# Patient Record
Sex: Female | Born: 1935 | Race: White | Hispanic: No
Health system: Midwestern US, Academic
[De-identification: ages and names within clinical notes are randomized; demographics above are authoritative.]

---

## 2017-04-03 MED ORDER — LISINOPRIL 20 MG PO TAB
ORAL_TABLET | Freq: Two times a day (BID) | 3 refills | Status: DC
Start: 2017-04-03 — End: 2017-11-13

## 2017-06-16 ENCOUNTER — Encounter: Admit: 2017-06-16 | Discharge: 2017-06-16 | Payer: MEDICARE

## 2017-06-16 MED ORDER — AMLODIPINE 5 MG PO TAB
ORAL_TABLET | Freq: Every day | 1 refills | Status: AC
Start: 2017-06-16 — End: 2018-02-24

## 2017-08-05 ENCOUNTER — Encounter: Admit: 2017-08-05 | Discharge: 2017-08-05 | Payer: MEDICARE

## 2017-08-05 MED ORDER — ATORVASTATIN 10 MG PO TAB
ORAL_TABLET | Freq: Every day | 1 refills | Status: AC
Start: 2017-08-05 — End: 2018-02-24

## 2017-08-05 MED ORDER — CARVEDILOL 12.5 MG PO TAB
ORAL_TABLET | ORAL | 1 refills | 90.00000 days | Status: AC
Start: 2017-08-05 — End: 2018-02-24

## 2017-11-13 ENCOUNTER — Encounter: Admit: 2017-11-13 | Discharge: 2017-11-13 | Payer: MEDICARE

## 2017-11-13 MED ORDER — LISINOPRIL 20 MG PO TAB
20 mg | ORAL_TABLET | Freq: Two times a day (BID) | ORAL | 3 refills | Status: AC
Start: 2017-11-13 — End: 2019-01-12

## 2017-11-13 NOTE — Telephone Encounter
electronic refill of lisinopril

## 2018-01-12 ENCOUNTER — Encounter: Admit: 2018-01-12 | Discharge: 2018-01-12 | Payer: MEDICARE

## 2018-01-28 ENCOUNTER — Encounter: Admit: 2018-01-28 | Discharge: 2018-01-28 | Payer: MEDICARE

## 2018-02-24 ENCOUNTER — Encounter: Admit: 2018-02-24 | Discharge: 2018-02-24 | Payer: MEDICARE

## 2018-02-24 MED ORDER — AMLODIPINE 5 MG PO TAB
ORAL_TABLET | Freq: Every day | 1 refills | Status: AC
Start: 2018-02-24 — End: 2018-09-07

## 2018-02-24 MED ORDER — ATORVASTATIN 10 MG PO TAB
ORAL_TABLET | Freq: Every day | 1 refills | Status: AC
Start: 2018-02-24 — End: 2018-09-07

## 2018-02-24 MED ORDER — CARVEDILOL 12.5 MG PO TAB
ORAL_TABLET | ORAL | 1 refills | 90.00000 days | Status: AC
Start: 2018-02-24 — End: 2018-09-07

## 2018-03-11 ENCOUNTER — Encounter: Admit: 2018-03-11 | Discharge: 2018-03-11 | Payer: MEDICARE

## 2018-03-11 ENCOUNTER — Ambulatory Visit: Admit: 2018-03-11 | Discharge: 2018-03-12 | Payer: MEDICARE

## 2018-03-11 DIAGNOSIS — I10 Essential (primary) hypertension: Secondary | ICD-10-CM

## 2018-03-11 DIAGNOSIS — I493 Ventricular premature depolarization: Secondary | ICD-10-CM

## 2018-03-11 DIAGNOSIS — Z780 Asymptomatic menopausal state: ICD-10-CM

## 2018-03-11 DIAGNOSIS — R609 Edema, unspecified: Principal | ICD-10-CM

## 2018-03-11 DIAGNOSIS — E785 Hyperlipidemia, unspecified: ICD-10-CM

## 2018-03-12 DIAGNOSIS — R9431 Abnormal electrocardiogram [ECG] [EKG]: ICD-10-CM

## 2018-03-12 DIAGNOSIS — R0609 Other forms of dyspnea: Principal | ICD-10-CM

## 2018-03-12 DIAGNOSIS — E785 Hyperlipidemia, unspecified: ICD-10-CM

## 2018-04-08 ENCOUNTER — Encounter: Admit: 2018-04-08 | Discharge: 2018-04-08 | Payer: MEDICARE

## 2018-04-08 DIAGNOSIS — I493 Ventricular premature depolarization: Principal | ICD-10-CM

## 2018-04-08 DIAGNOSIS — I10 Essential (primary) hypertension: ICD-10-CM

## 2018-04-08 LAB — COMPREHENSIVE METABOLIC PANEL
Lab: 78
Lab: 94
Lab: 136

## 2018-04-08 LAB — LIPID PROFILE
Lab: 80
Lab: 95
Lab: 180
Lab: 81

## 2018-04-08 LAB — MAGNESIUM: Lab: 2.5

## 2018-04-08 LAB — THYROID STIMULATING HORMONE-TSH: Lab: 4.4

## 2018-04-08 LAB — CBC: Lab: 5.3

## 2018-04-09 ENCOUNTER — Encounter: Admit: 2018-04-09 | Discharge: 2018-04-09 | Payer: MEDICARE

## 2018-04-10 ENCOUNTER — Ambulatory Visit: Admit: 2018-04-10 | Discharge: 2018-04-10 | Payer: MEDICARE

## 2018-04-10 ENCOUNTER — Encounter: Admit: 2018-04-10 | Discharge: 2018-04-10 | Payer: MEDICARE

## 2018-04-10 DIAGNOSIS — I10 Essential (primary) hypertension: ICD-10-CM

## 2018-04-10 DIAGNOSIS — I493 Ventricular premature depolarization: Principal | ICD-10-CM

## 2018-04-10 DIAGNOSIS — R609 Edema, unspecified: Principal | ICD-10-CM

## 2018-04-10 DIAGNOSIS — Z780 Asymptomatic menopausal state: ICD-10-CM

## 2018-04-10 DIAGNOSIS — E785 Hyperlipidemia, unspecified: ICD-10-CM

## 2018-05-08 ENCOUNTER — Ambulatory Visit: Admit: 2018-05-08 | Discharge: 2018-05-09 | Payer: MEDICARE

## 2018-05-08 ENCOUNTER — Encounter: Admit: 2018-05-08 | Discharge: 2018-05-08 | Payer: MEDICARE

## 2018-05-08 DIAGNOSIS — I10 Essential (primary) hypertension: ICD-10-CM

## 2018-05-08 DIAGNOSIS — R609 Edema, unspecified: Principal | ICD-10-CM

## 2018-05-08 DIAGNOSIS — R002 Palpitations: Secondary | ICD-10-CM

## 2018-05-08 DIAGNOSIS — I493 Ventricular premature depolarization: Secondary | ICD-10-CM

## 2018-05-08 DIAGNOSIS — Z780 Asymptomatic menopausal state: ICD-10-CM

## 2018-05-08 DIAGNOSIS — E785 Hyperlipidemia, unspecified: ICD-10-CM

## 2018-05-09 DIAGNOSIS — I10 Essential (primary) hypertension: Principal | ICD-10-CM

## 2018-05-09 DIAGNOSIS — I491 Atrial premature depolarization: ICD-10-CM

## 2018-05-09 DIAGNOSIS — G4733 Obstructive sleep apnea (adult) (pediatric): Secondary | ICD-10-CM

## 2018-05-09 DIAGNOSIS — E785 Hyperlipidemia, unspecified: ICD-10-CM

## 2018-09-07 ENCOUNTER — Encounter: Admit: 2018-09-07 | Discharge: 2018-09-07 | Payer: MEDICARE

## 2018-09-07 MED ORDER — CARVEDILOL 12.5 MG PO TAB
ORAL_TABLET | ORAL | 1 refills | 90.00000 days | Status: AC
Start: 2018-09-07 — End: 2019-05-04

## 2018-09-07 MED ORDER — ATORVASTATIN 10 MG PO TAB
ORAL_TABLET | Freq: Every day | 1 refills | Status: AC
Start: 2018-09-07 — End: 2020-04-19

## 2018-09-07 MED ORDER — AMLODIPINE 5 MG PO TAB
ORAL_TABLET | Freq: Every day | 1 refills | Status: AC
Start: 2018-09-07 — End: 2018-09-11

## 2018-09-11 ENCOUNTER — Encounter: Admit: 2018-09-11 | Discharge: 2018-09-11 | Payer: MEDICARE

## 2018-09-11 MED ORDER — AMLODIPINE 5 MG PO TAB
5 mg | ORAL_TABLET | Freq: Every day | ORAL | 1 refills | Status: AC
Start: 2018-09-11 — End: 2018-09-18

## 2018-09-18 ENCOUNTER — Encounter: Admit: 2018-09-18 | Discharge: 2018-09-18 | Payer: MEDICARE

## 2018-09-18 MED ORDER — AMLODIPINE 5 MG PO TAB
5 mg | ORAL_TABLET | Freq: Every day | ORAL | 3 refills | Status: AC
Start: 2018-09-18 — End: 2019-11-24

## 2018-11-20 ENCOUNTER — Encounter: Admit: 2018-11-20 | Discharge: 2018-11-20 | Payer: MEDICARE

## 2018-11-20 ENCOUNTER — Ambulatory Visit: Admit: 2018-11-20 | Discharge: 2018-11-21 | Payer: MEDICARE

## 2018-11-20 DIAGNOSIS — Z780 Asymptomatic menopausal state: ICD-10-CM

## 2018-11-20 DIAGNOSIS — I10 Essential (primary) hypertension: ICD-10-CM

## 2018-11-20 DIAGNOSIS — E785 Hyperlipidemia, unspecified: ICD-10-CM

## 2018-11-20 DIAGNOSIS — R609 Edema, unspecified: Principal | ICD-10-CM

## 2018-11-21 DIAGNOSIS — I10 Essential (primary) hypertension: Principal | ICD-10-CM

## 2018-11-21 DIAGNOSIS — Z8679 Personal history of other diseases of the circulatory system: ICD-10-CM

## 2018-11-21 DIAGNOSIS — I493 Ventricular premature depolarization: Secondary | ICD-10-CM

## 2018-11-21 DIAGNOSIS — E785 Hyperlipidemia, unspecified: Secondary | ICD-10-CM

## 2019-01-12 ENCOUNTER — Encounter: Admit: 2019-01-12 | Discharge: 2019-01-12 | Payer: MEDICARE

## 2019-01-12 MED ORDER — LISINOPRIL 20 MG PO TAB
ORAL_TABLET | Freq: Two times a day (BID) | 1 refills | Status: AC
Start: 2019-01-12 — End: 2019-08-02

## 2019-02-04 ENCOUNTER — Encounter: Admit: 2019-02-04 | Discharge: 2019-02-04 | Payer: MEDICARE

## 2019-04-12 ENCOUNTER — Encounter: Admit: 2019-04-12 | Discharge: 2019-04-12 | Payer: MEDICARE

## 2019-04-12 NOTE — Telephone Encounter
Pt's daughter Darel Hong called to let us know pt is having lower extremity edema for the past few weeks and is progressively getting worse. Daughter states PCP Dr Kandis Ban took her off of diuretic recently. Pt slightly increased SOA. Dr Bufford Buttner is out for a couple of weeks. Daughter will call PCP and have her seen by PCP to see about putting her back on diuretic. Daughter was offered appointment through tele health but declined stating they do not have good wifi. Daughter will call us back if she is unable to see Dr Kandis Ban.

## 2019-05-04 ENCOUNTER — Encounter: Admit: 2019-05-04 | Discharge: 2019-05-04 | Payer: MEDICARE

## 2019-05-04 MED ORDER — CARVEDILOL 12.5 MG PO TAB
ORAL_TABLET | ORAL | 2 refills | 90.00000 days | Status: DC
Start: 2019-05-04 — End: 2020-02-22

## 2019-05-18 ENCOUNTER — Encounter: Admit: 2019-05-18 | Discharge: 2019-05-18

## 2019-05-18 NOTE — Progress Notes
Patient: Nicole Avery, DOB: [birth year]    Requesting most recent blood work and/or testing, to be faxed for visit with Dr. Murvin Natal.    Please fax to  Attention: Dr. Murvin Natal at the [facility]    Fax number [phone]  Thank you.

## 2019-05-20 ENCOUNTER — Encounter: Admit: 2019-05-20 | Discharge: 2019-05-20

## 2019-05-20 NOTE — Progress Notes
05/20/2019 8:45 AM Labs entered from PCP for our records for upcoming OV.

## 2019-06-01 ENCOUNTER — Encounter: Admit: 2019-06-01 | Discharge: 2019-06-01

## 2019-06-01 DIAGNOSIS — E7849 Other hyperlipidemia: Secondary | ICD-10-CM

## 2019-06-01 DIAGNOSIS — R609 Edema, unspecified: Secondary | ICD-10-CM

## 2019-06-01 DIAGNOSIS — I10 Essential (primary) hypertension: Secondary | ICD-10-CM

## 2019-06-01 DIAGNOSIS — I493 Ventricular premature depolarization: Secondary | ICD-10-CM

## 2019-06-01 DIAGNOSIS — E785 Hyperlipidemia, unspecified: Secondary | ICD-10-CM

## 2019-06-01 DIAGNOSIS — R002 Palpitations: Secondary | ICD-10-CM

## 2019-06-01 DIAGNOSIS — Z780 Asymptomatic menopausal state: Secondary | ICD-10-CM

## 2019-06-01 DIAGNOSIS — R0609 Other forms of dyspnea: Secondary | ICD-10-CM

## 2019-06-01 NOTE — Progress Notes
Date of Service: 06/01/2019    Nicole Avery is a 85 y.o. female.       HPI     It was a pleasure to see Nicole Avery in my office today for her cardiovascular followup.     As you may recall, Nicole Avery has recently turned 85 years of age.  For her age, she is doing fairly good.  Because of the coronavirus, unfortunately, she has been staying mostly indoors.  She still works in her garden.     Apparently sometime in the last month or so, she developed some swelling of her legs.  She was started on a diuretic for about a month.  However, after a week, she was told to discontinue the diuretic and use it only on a p.r.n. basis.  She does not remember the name of the diuretic.     She states that her legs swell up on and off.  However, today in the clinic, there was very little leg edema, mostly in the right lower extremity.  Her weight has been stable.  She does not seem to be in any volume overload as such.     Considering her age, I felt that it may be a good idea for her not to take the diuretic on a regular basis and, as you had suggested, to take it only on a p.r.n. basis.  We have told her that if she gains more than 2 pounds of weight on 2 consecutive days, then she can take her diuretic.     I have also advised her to continue to follow her leg edema.  If the right leg gets any worse, we may consider a venous duplex study to rule out DVT also in her.     Blood pressures appear to be reasonably controlled at this time.  So I did not make any changes with her medications.     In summary, the recommendations were as follows:     1. Continue to monitor your weight.  If you gain more than 2 pounds on 2 consecutive days, take a diuretic pill.  2. Low-sodium diet.    3. If you feel that the right leg is unilaterally swollen more, please give me a call.    4. Continue to be physically active even though you are in quarantine.  It is safe to go outside, especially in the early mornings and late evenings when there are not too many people outside.  5. Follow up with me in 6 months unless there is any change in your symptoms.     Total time spent with the patient, including counseling, was approximately 20 minutes in the clinic.    ([id number])             Vitals:    06/01/19 1523   BP: (!) 148/76   BP Source: Arm, Left Upper   Weight: 80.3 kg (177 lb)   Height: 1.575 m (5' 2)   PainSc: Zero     Body mass index is 32.37 kg/m???.     Past Medical History  Patient Active Problem List    Diagnosis Date Noted   ??? PVC (premature ventricular contraction) 02/25/2014   ??? SOB (shortness of breath) 11/05/2011   ??? Dyslipidemia 11/05/2011   ??? GERD (gastroesophageal reflux disease) 11/04/2011   ??? HTN (hypertension) 11/04/2011         Review of Systems   Cardiovascular: Positive for leg swelling.       Physical  Exam  General Appearance: resting comfortably, no acute distress   Neck Veins: neck veins are flat, neck veins are not distended   Thyroid: no nodules, masses, tenderness or enlargement   Respiratory Effort: breathing is unlabored, no respiratory distress   Auscultation/Percussion: lungs clear to auscultation, no rales, rhonchi, or wheezing   PMI: PMI not enlarged or displaced   Cardiac Rhythm: regular rhythm and normal rate   Cardiac Auscultation: Normal S1 &???S2, no S3 or S4, no rub   Murmurs:???2/6SEM at the base???  Carotid Arteries: normal carotid upstroke bilaterally, no bruits   Pedal Pulses: normal symmetric pedal pulses   Lower Extremity Edema: no???significant???lower extremity edema   Abdominal Exam: soft, non-tender, no masses, bowel sounds normal   Abdominal Aorta: nonpalpable abdominal aorta; no abdominal bruits   Liver &???Spleen: no organomegaly   Neurologic Exam: neurological assessment grossly intact   Orientation: oriented to time, place and person    Cardiovascular Studies  1. May 28, 2014, regadenoson stress thallium showed no evidence of any significant ischemia. Normal left ventricular function. 2. June 02, 2014, echo Doppler. Normal left ventricular function. Ejection fraction 65%. Mild mitral and mild tricuspid regurgitation.  3. June 23, 2014, Holter monitor revealed predominantly a sinus rhythm with an average heart rate of 66 beats per minute. Frequent premature ventricular ectopic beats were noted with ventricular trigeminy, bigeminy and couplets. Total ventricular beats were noted to be about 10% of her recorded heartbeats. No episodes of ventricular tachycardia were noted.  4. January 24, 2016: ???Echo Doppler normal left ventricular size and normal ventricular function, ejection fraction 65%, mild left atrial enlargement. ???No significant valvular abnormalities. ???No pericardial effusion.    Problems Addressed Today  No diagnosis found.    Assessment and Plan     1.   Dyspnea on exertion and leg edema, stable.??????Echo Doppler done???in 2017???had revealed a normal LV function.  2.   History of frequent premature ventricular contraction, asymptomatic.   3.   Hypertension.??????Blood pressures are reasonably well controlled.   4.   Dyslipidemia.??? Good lipid numbers.         Current Medications (including today's revisions)  ??? amLODIPine (NORVASC) 5 mg tablet Take one tablet by mouth daily.   ??? atorvastatin (LIPITOR) 10 mg tablet TAKE 1 TABLET BY MOUTH ONCE DAILY   ??? carvediloL (COREG) 12.5 mg tablet TAKE 1 & 1/2 (ONE & ONE-HALF) TABLETS BY MOUTH IN THE MORNING AND 1 IN THE EVENING   ??? lisinopril (ZESTRIL) 20 mg tablet TAKE 1 TABLET BY MOUTH TWICE DAILY   ??? SUCRALFATE PO Take 150 mg by mouth four times daily.   ??? vit C/vit E ac/lut/copper/zinc (PRESERVISION LUTEIN PO) Take  by mouth.

## 2019-06-02 ENCOUNTER — Ambulatory Visit: Admit: 2019-06-01 | Discharge: 2019-06-02

## 2019-06-02 DIAGNOSIS — R600 Localized edema: Secondary | ICD-10-CM

## 2019-06-02 DIAGNOSIS — Z8679 Personal history of other diseases of the circulatory system: Secondary | ICD-10-CM

## 2019-06-02 DIAGNOSIS — I10 Essential (primary) hypertension: Secondary | ICD-10-CM

## 2019-08-01 ENCOUNTER — Encounter: Admit: 2019-08-01 | Discharge: 2019-08-01

## 2019-08-02 MED ORDER — LISINOPRIL 20 MG PO TAB
ORAL_TABLET | Freq: Two times a day (BID) | 3 refills | Status: AC
Start: 2019-08-02 — End: ?

## 2019-11-24 ENCOUNTER — Encounter: Admit: 2019-11-24 | Discharge: 2019-11-24 | Payer: MEDICARE

## 2019-11-24 MED ORDER — AMLODIPINE 5 MG PO TAB
ORAL_TABLET | Freq: Every day | 1 refills | Status: DC
Start: 2019-11-24 — End: 2020-05-26

## 2020-02-22 ENCOUNTER — Encounter: Admit: 2020-02-22 | Discharge: 2020-02-22 | Payer: MEDICARE

## 2020-02-22 MED ORDER — CARVEDILOL 12.5 MG PO TAB
ORAL_TABLET | ORAL | 0 refills | 90.00000 days | Status: DC
Start: 2020-02-22 — End: 2020-05-26

## 2020-03-21 ENCOUNTER — Encounter: Admit: 2020-03-21 | Discharge: 2020-03-21 | Payer: MEDICARE

## 2020-03-21 NOTE — Progress Notes
Patient: Nicole Avery, DOB: 1923/12/18    Requesting most recent blood work and/or testing, to be faxed to Dr. Bufford Buttner.    Please fax to  Attention: Dr. Bufford Buttner at the [facility]    Fax number [phone]  Thank you.

## 2020-03-28 NOTE — Patient Instructions
Thank you for visiting our office today.     Please take your medications regularly. ?Check your list with what you have on hand at home.     We recommend follow-up with our office in 6 months. ?Please call [phone] in 2 months to schedule your 6 month follow up.    Please complete the following:     Check you labs at your earliest convenience, plan to fast 10-12 hours prior.     Weigh yourself daily after waking up and using toilet, before you eat or drink anything.   Record the weights and report weight gain of 2 pounds in two days or 5 pounds in five days.   Heart healthy, low salt diet will also help keep blood pressure lower and prevent water weight gain. Do not add any salt to your food, and limit fried food, canned foods and lunch meat, which all typically have added salt.

## 2020-03-28 NOTE — Progress Notes
Date of Service: 03/28/2020    Nicole Avery is a 86 y.o. female.       HPI     I had the pleasure of seeing Nicole Avery in my office today for her cardiovascular followup.  As always, she was accompanied by her daughter, Raynelle Fanning.     Nicole Avery is 86 years old with a history of congestive heart failure with preserved ejection fraction.     She continues to complain of dyspnea on exertion and feeling tired.  However, there has been no significant change in the last 6 months.  Considering her age, she is pretty active.  Due to the coronavirus guidelines, obviously, much of going out of the house has been very limited over the year.  On and off, she says, she notices swelling of her legs, but most of the time, she says, she does pretty well with the swelling.  There is no history of orthopnea or paroxysmal nocturnal dyspnea.     On physical examination, her vital signs are stable.  Lungs are clear.  Heart sounds are regular.  Extremities show no significant edema.  Her weight is stable at 177 pounds.  There are no clinical signs of volume overload.    ([id number])             Vitals:    03/28/20 1407 03/28/20 1415   BP: 120/70 138/70   BP Source: Arm, Left Lower Arm, Right Upper   Patient Position: Sitting Sitting   Pulse: 62    Weight: 80.4 kg (177 lb 4.8 oz)    Height: 1.575 m (5' 2)    PainSc: Zero      Body mass index is 32.43 kg/m?Marland Kitchen     Past Medical History  Patient Active Problem List    Diagnosis Date Noted   ? PVC (premature ventricular contraction) 02/25/2014   ? SOB (shortness of breath) 11/05/2011   ? Dyslipidemia 11/05/2011   ? GERD (gastroesophageal reflux disease) 11/04/2011   ? HTN (hypertension) 11/04/2011         Review of Systems   Constitution: Negative.   HENT: Negative.    Eyes: Negative.    Cardiovascular: Positive for dyspnea on exertion and leg swelling.   Respiratory: Negative.    Endocrine: Negative.    Hematologic/Lymphatic: Negative.    Skin: Negative.    Gastrointestinal: Negative. Genitourinary: Negative.    Neurological: Positive for loss of balance.   Psychiatric/Behavioral: Negative.    Allergic/Immunologic: Negative.        Physical Exam  General Appearance: resting comfortably, no acute distress   Neck Veins: neck veins are not distended   Thyroid: no nodules, masses, tenderness or enlargement   Respiratory Effort: breathing is unlabored, no respiratory distress   Auscultation/Percussion: lungs clear to auscultation, no rales, rhonchi, or wheezing   PMI: PMI not enlarged or displaced   Cardiac Rhythm: regular rhythm and normal rate   Cardiac Auscultation: Normal S1 &?S2, no S3 or S4, no rub   Murmurs:?2/6SEM at the base?  Carotid Arteries: normal carotid upstroke bilaterally, no bruits   Pedal Pulses: normal symmetric pedal pulses   Lower Extremity Edema: no?significant?lower extremity edema   Abdominal Exam: soft, non-tender, no masses, bowel sounds normal   Abdominal Aorta: nonpalpable abdominal aorta; no abdominal bruits   Liver &?Spleen: no organomegaly   Neurologic Exam: neurological assessment grossly intact   Orientation: oriented to time, place and person    Cardiovascular Studies  1. May 28, 2014, regadenoson  stress thallium showed no evidence of any significant ischemia. Normal left ventricular function.  2. June 02, 2014, echo Doppler. Normal left ventricular function. Ejection fraction 65%. Mild mitral and mild tricuspid regurgitation.  3. June 23, 2014, Holter monitor revealed predominantly a sinus rhythm with an average heart rate of 66 beats per minute. Frequent premature ventricular ectopic beats were noted with ventricular trigeminy, bigeminy and couplets. Total ventricular beats were noted to be about 10% of her recorded heartbeats. No episodes of ventricular tachycardia were noted.  4. January 24, 2016: ?Echo Doppler normal left ventricular size and normal ventricular function, ejection fraction 65%, mild left atrial enlargement. ?No significant valvular abnormalities. ?No pericardial effusion.  5. EKG today shows sinus rhythm rate of 62 bpm.  There is borderline first-degree AV block.  Left axis deviation with left anterior fascicular block is noted.  There is no significant ST-T changes noted.  There are no significant changes noted as compared to the EKG done on Apr 10, 2018.    Problems Addressed Today  Encounter Diagnoses   Name Primary?   ? Essential hypertension Yes   ? Dyslipidemia    ? PVC (premature ventricular contraction)    ? Obstructive sleep apnea        Assessment and Plan     1.  Congestive heart failure with preserved ejection fraction.  Clinical hemodynamically doing well.  Symptoms are stable.  No signs of volume overload.  Patient given guidelines to make sure that she continues to have low-sodium diet and follow her daily weights.  Needs a follow-up metabolic profile.  2.   History of frequent premature ventricular contraction, asymptomatic.   3.   Hypertension.??Blood pressures are reasonably well controlled.   4.   Dyslipidemia.? Good lipid numbers.  Needs a follow-up lipid check.         Current Medications (including today's revisions)  ? amLODIPine (NORVASC) 5 mg tablet Take 1 tablet by mouth once daily   ? atorvastatin (LIPITOR) 10 mg tablet TAKE 1 TABLET BY MOUTH ONCE DAILY   ? carvediloL (COREG) 12.5 mg tablet TAKE 1 & 1/2 (ONE & ONE-HALF) TABLETS BY MOUTH IN THE MORNING AND 1 TABLET IN THE EVENING   ? lisinopriL (ZESTRIL) 20 mg tablet Take 1 tablet by mouth twice daily   ? SUCRALFATE PO Take 150 mg by mouth four times daily.   ? vit C/vit E ac/lut/copper/zinc (PRESERVISION LUTEIN PO) Take  by mouth.

## 2020-04-18 ENCOUNTER — Encounter: Admit: 2020-04-18 | Discharge: 2020-04-18 | Payer: MEDICARE

## 2020-04-18 DIAGNOSIS — R0600 Dyspnea, unspecified: Secondary | ICD-10-CM

## 2020-04-18 DIAGNOSIS — E785 Hyperlipidemia, unspecified: Secondary | ICD-10-CM

## 2020-04-18 DIAGNOSIS — I10 Essential (primary) hypertension: Secondary | ICD-10-CM

## 2020-04-18 LAB — COMPREHENSIVE METABOLIC PANEL
Lab: 100 pg (ref 26–34)
Lab: 137 % (ref 40–50)
Lab: 22 g/dL (ref 32.0–36.0)
Lab: 4.3 FL (ref 80–100)

## 2020-04-18 LAB — LIPID PROFILE
Lab: 137 10*3/uL (ref 150–400)
Lab: 256 % — ABNORMAL HIGH (ref 100–199)
Lab: 64 FL — ABNORMAL LOW (ref 7–11)

## 2020-04-19 ENCOUNTER — Encounter: Admit: 2020-04-19 | Discharge: 2020-04-19 | Payer: MEDICARE

## 2020-04-19 MED ORDER — ATORVASTATIN 10 MG PO TAB
10 mg | ORAL_TABLET | Freq: Every day | ORAL | 1 refills | Status: AC
Start: 2020-04-19 — End: ?

## 2020-05-26 ENCOUNTER — Encounter: Admit: 2020-05-26 | Discharge: 2020-05-26 | Payer: MEDICARE

## 2020-05-26 MED ORDER — AMLODIPINE 5 MG PO TAB
ORAL_TABLET | Freq: Every day | 0 refills | Status: AC
Start: 2020-05-26 — End: ?

## 2020-05-26 MED ORDER — CARVEDILOL 12.5 MG PO TAB
ORAL_TABLET | ORAL | 0 refills | 90.00000 days | Status: AC
Start: 2020-05-26 — End: ?

## 2020-09-11 ENCOUNTER — Encounter: Admit: 2020-09-11 | Discharge: 2020-09-11 | Payer: MEDICARE

## 2020-09-11 MED ORDER — LISINOPRIL 20 MG PO TAB
ORAL_TABLET | Freq: Two times a day (BID) | 0 refills
Start: 2020-09-11 — End: ?

## 2020-09-11 MED ORDER — AMLODIPINE 5 MG PO TAB
ORAL_TABLET | Freq: Every day | 3 refills | Status: AC
Start: 2020-09-11 — End: ?

## 2020-09-11 MED ORDER — CARVEDILOL 12.5 MG PO TAB
ORAL_TABLET | ORAL | 3 refills | 90.00000 days | Status: AC
Start: 2020-09-11 — End: ?

## 2020-11-14 ENCOUNTER — Encounter: Admit: 2020-11-14 | Discharge: 2020-11-14 | Payer: MEDICARE

## 2020-11-14 NOTE — Progress Notes
11/14/2020 1:23 PM   Patient's daughter called to request an appointment with [facility] for "mini-strokes." Daughter Darel Hong is concerned that pt has been having TIAs. Pt reported to Darel Hong that sometimes at night her "feet don't work" and that yesterday she walked into the kitchen and couldn't remember why she went in. Asked Darel Hong if pt had considered going to the ED for any of these concerns. Daughter states that pt flatly refuses to go to the ED and requested to just see [facility]. Offered work-in appointment on 12/14. Pt unavailable that day. Work in scheduled for 12/20 with [facility]. Darel Hong will be accompanying patient to this appointment. Confirmed date time and location. Reinforced to Darel Hong to take pt to ED if she sees any signs or symptoms that concern her for a stroke.

## 2020-12-11 ENCOUNTER — Encounter: Admit: 2020-12-11 | Discharge: 2020-12-11 | Payer: MEDICARE

## 2020-12-11 MED ORDER — ATORVASTATIN 10 MG PO TAB
ORAL_TABLET | Freq: Every day | 0 refills | Status: AC
Start: 2020-12-11 — End: ?

## 2021-01-10 ENCOUNTER — Encounter: Admit: 2021-01-10 | Discharge: 2021-01-10 | Payer: MEDICARE

## 2021-01-10 NOTE — Progress Notes
Nicole Avery, 06-13-1924 has an appointment with Dr. Bufford Buttner on 01/16/21.    Please send recent lab results for continuity of care.    Thank you,   [name]    Phone: [phone]  Fax: [phone]

## 2021-01-16 ENCOUNTER — Encounter: Admit: 2021-01-16 | Discharge: 2021-01-16 | Payer: MEDICARE

## 2021-01-16 ENCOUNTER — Ambulatory Visit: Admit: 2021-01-16 | Discharge: 2021-01-17 | Payer: MEDICARE

## 2021-01-16 DIAGNOSIS — I10 Essential (primary) hypertension: Secondary | ICD-10-CM

## 2021-01-16 DIAGNOSIS — R0600 Dyspnea, unspecified: Secondary | ICD-10-CM

## 2021-01-16 DIAGNOSIS — E785 Hyperlipidemia, unspecified: Secondary | ICD-10-CM

## 2021-01-16 DIAGNOSIS — R609 Edema, unspecified: Secondary | ICD-10-CM

## 2021-01-16 DIAGNOSIS — Z780 Asymptomatic menopausal state: Secondary | ICD-10-CM

## 2021-01-17 DIAGNOSIS — I5032 Chronic diastolic (congestive) heart failure: Secondary | ICD-10-CM

## 2021-02-07 ENCOUNTER — Ambulatory Visit: Admit: 2021-02-07 | Discharge: 2021-02-07 | Payer: MEDICARE

## 2021-02-07 ENCOUNTER — Encounter: Admit: 2021-02-07 | Discharge: 2021-02-07 | Payer: MEDICARE

## 2021-02-07 DIAGNOSIS — E785 Hyperlipidemia, unspecified: Secondary | ICD-10-CM

## 2021-02-07 DIAGNOSIS — I10 Essential (primary) hypertension: Secondary | ICD-10-CM

## 2021-02-07 DIAGNOSIS — I5032 Chronic diastolic (congestive) heart failure: Secondary | ICD-10-CM

## 2021-02-07 DIAGNOSIS — R0600 Dyspnea, unspecified: Secondary | ICD-10-CM

## 2021-02-13 ENCOUNTER — Encounter: Admit: 2021-02-13 | Discharge: 2021-02-13 | Payer: MEDICARE

## 2021-03-09 ENCOUNTER — Encounter: Admit: 2021-03-09 | Discharge: 2021-03-09 | Payer: MEDICARE

## 2021-03-09 MED ORDER — ATORVASTATIN 10 MG PO TAB
ORAL_TABLET | Freq: Every day | 0 refills | Status: AC
Start: 2021-03-09 — End: ?

## 2021-03-15 ENCOUNTER — Encounter: Admit: 2021-03-15 | Discharge: 2021-03-15 | Payer: MEDICARE

## 2021-03-15 MED ORDER — ATORVASTATIN 10 MG PO TAB
ORAL_TABLET | Freq: Every day | 0 refills | Status: AC
Start: 2021-03-15 — End: ?

## 2021-03-27 ENCOUNTER — Encounter: Admit: 2021-03-27 | Discharge: 2021-03-27 | Payer: MEDICARE

## 2021-03-27 DIAGNOSIS — E785 Hyperlipidemia, unspecified: Secondary | ICD-10-CM

## 2021-03-27 DIAGNOSIS — R0600 Dyspnea, unspecified: Secondary | ICD-10-CM

## 2021-03-27 DIAGNOSIS — I5032 Chronic diastolic (congestive) heart failure: Secondary | ICD-10-CM

## 2021-03-27 DIAGNOSIS — I10 Essential (primary) hypertension: Secondary | ICD-10-CM

## 2021-03-27 LAB — COMPREHENSIVE METABOLIC PANEL
Lab: 0.7
Lab: 0.9
Lab: 102
Lab: 112 — AB
Lab: 14
Lab: 140
Lab: 29
Lab: 3.6
Lab: 5.1
Lab: 6.7
Lab: 8.8
Lab: 83

## 2021-03-29 ENCOUNTER — Encounter: Admit: 2021-03-29 | Discharge: 2021-03-29 | Payer: MEDICARE

## 2021-08-16 ENCOUNTER — Encounter: Admit: 2021-08-16 | Discharge: 2021-08-16 | Payer: MEDICARE

## 2021-08-16 NOTE — Progress Notes
Nicole Avery, March 16, 1924 has an appointment with Dr. Bufford Buttner on 08/24/21.    Please send recent lab results for continuity of care.    Thank you,   [name]    Phone: [phone]  Fax: [phone]

## 2021-08-20 ENCOUNTER — Encounter: Admit: 2021-08-20 | Discharge: 2021-08-20 | Payer: MEDICARE

## 2021-08-20 NOTE — Progress Notes
No new labs per PCP

## 2021-08-27 ENCOUNTER — Encounter: Admit: 2021-08-27 | Discharge: 2021-08-27 | Payer: MEDICARE

## 2021-09-19 ENCOUNTER — Encounter: Admit: 2021-09-19 | Discharge: 2021-09-19 | Payer: MEDICARE

## 2021-09-19 MED ORDER — LISINOPRIL 20 MG PO TAB
ORAL_TABLET | Freq: Two times a day (BID) | 1 refills | Status: AC
Start: 2021-09-19 — End: ?

## 2021-09-19 NOTE — Telephone Encounter
Patient overdue for OV will send IB to scheduler.

## 2021-11-29 IMAGING — CR [ID]
1 series · 1 of 1 positions shown · non-contrast
Comparison: none

[x chest ap]
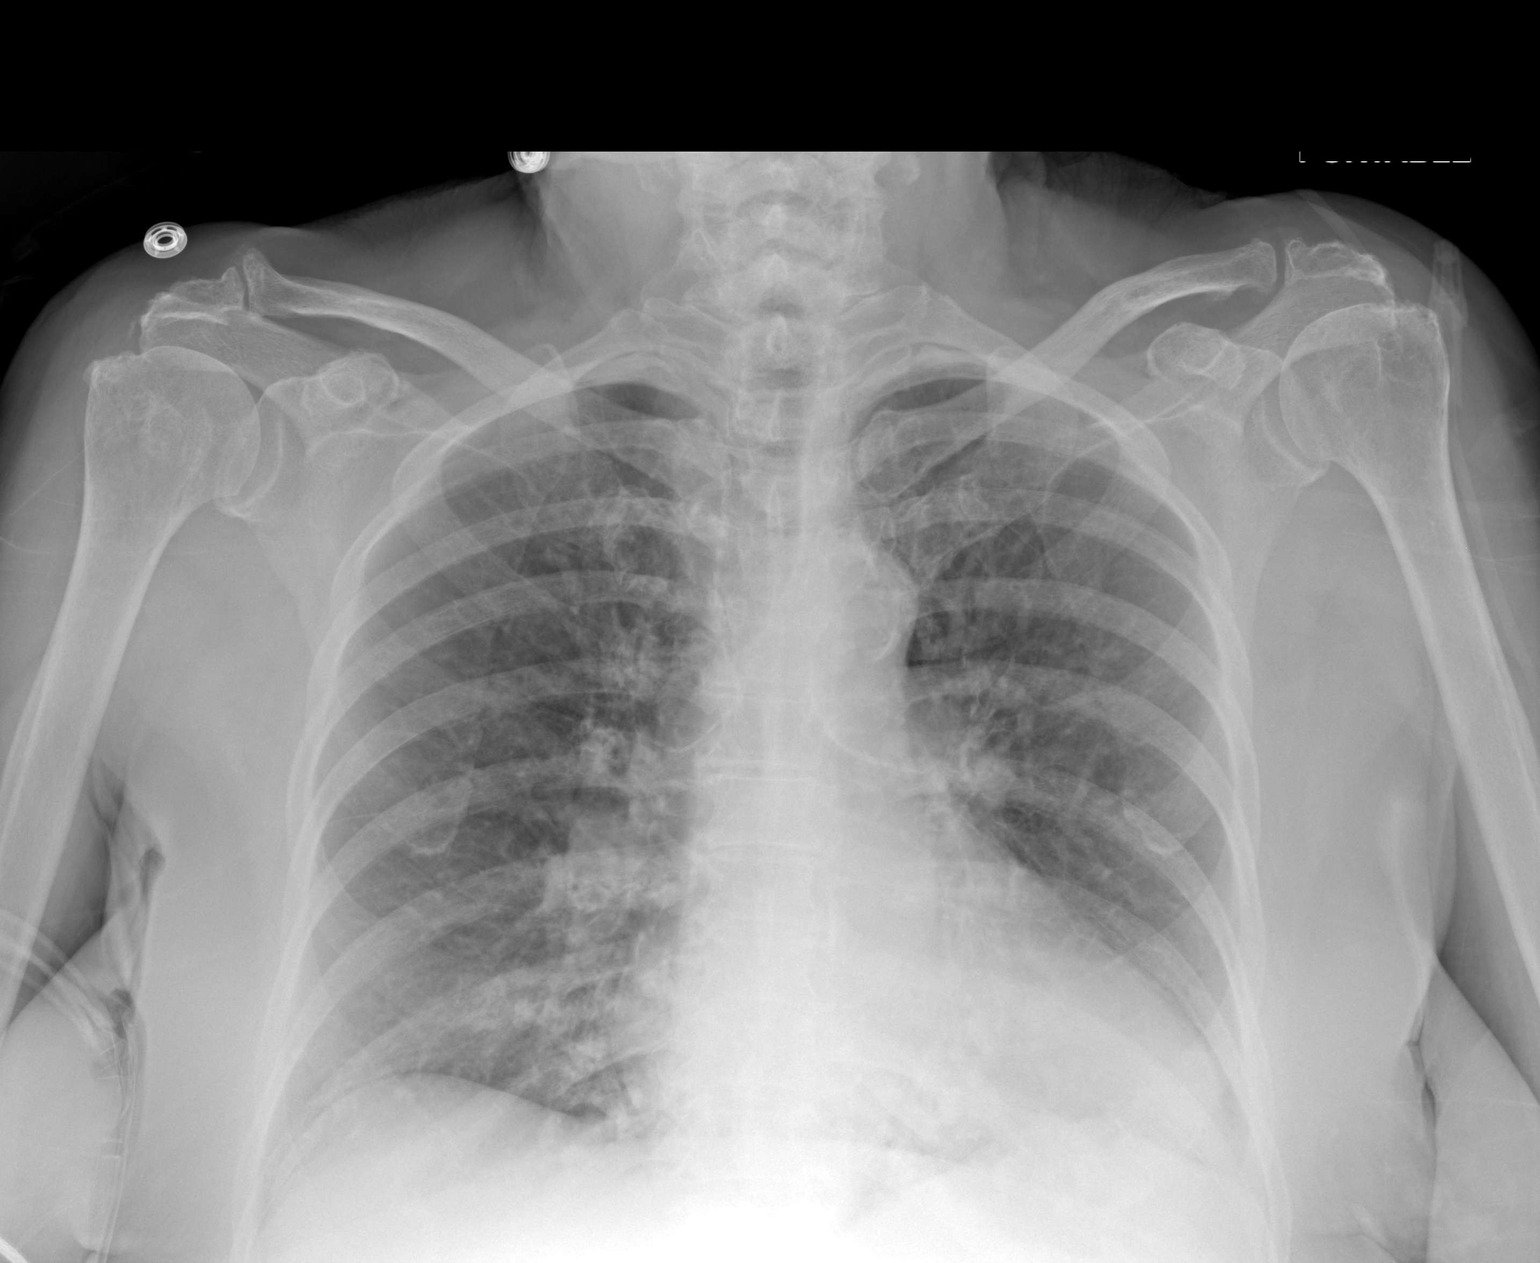

[1 of 1 positions shown; findings below may reference images not displayed]

Primary:     TEL, AATMAN                                                        Date: 12/

DIAGNOSTIC STUDIES

EXAM

XR chest 1V

INDICATION

ams
Altered mental status. CF/HB

TECHNIQUE

Portable upright chest

COMPARISONS

None available

FINDINGS

Borderline cardiomegaly is noted. No acute infiltrates are seen. Osseous structures are within
normal limits. There is calcification of the mitral valve annulus.

IMPRESSION

No acute infiltrates.

Tech Notes:

Altered mental status. CF/HB

## 2021-11-29 IMAGING — CT AGHEAD
1 series · 1 of 1 positions shown, 2 images · non-contrast
Comparison: none

[Series 15: cta brain and/or neck 1.00 bv36 s3 · axial · 0.59mm/px · z∈[-602,-602]mm · 1 of 1 slices shown, 2 images]
[im 1/1  soft-tissue]
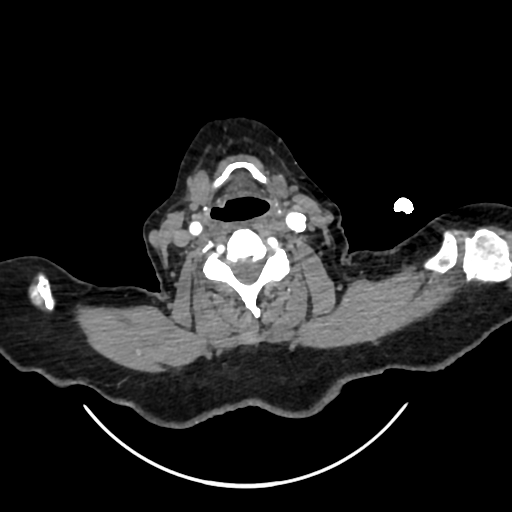
[im 1/1  bone]
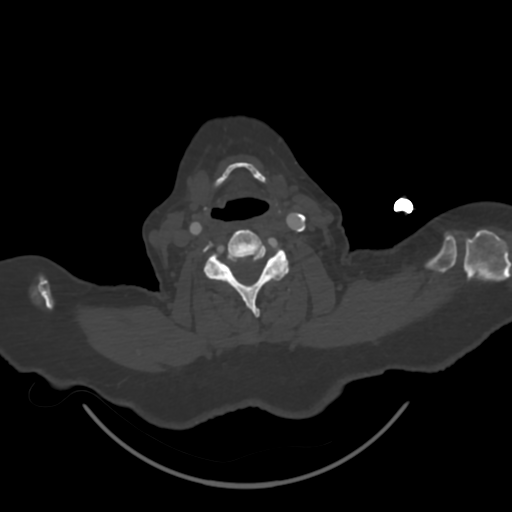

[1 of 1 positions shown; findings below may reference images not displayed]

Primary:     MASANE, SHUBAM                                                        Date: 12/

DIAGNOSTIC STUDIES

EXAM

CT angiogram of the head neck with IV contrast

INDICATION

Stroke
stroke , ams

TECHNIQUE

All CT scans at this facility use dose modulation, iterative reconstruction, and/or weight based
dosing when appropriate to reduce radiation dose to as low as reasonably achievable. 3D reformats

Number of previous computed tomography exams in the last 12 months is 0  .

Number of previous nuclear medicine myocardial perfusion studies in the last 12 months is 0  .

COMPARISONS

None available.

FINDINGS

CTA HEAD:

Bilateral ACA, MCA, and PCA are patent.

CTA NECK:

Mild plaque of the aortic arch and proximal vessels. Bilateral vertebral arteries are patent.

Right common carotid and ICA demonstrate mild plaque. No stenosis by NASCET criteria.

Minimal plaque in the left carotid bulb. No stenosis by NASCET criteria.

No focal consolidation in the lung apices. No acute osseous findings. Degenerative disc disease in
the cervical spine.

IMPRESSION

No hemodynamically significant stenosis, occlusion, or dilatation in the head or neck.

Tech Notes:

stroke r/o

## 2021-11-29 IMAGING — CT BRAIN WO(Adult)
3 of 4 series · 14 of 47 positions shown, 16 images · non-contrast
Comparison: none

[Series 4: brain cor 5.00 hr40 s3 · coronal · 0.28mm/px · 3 of 35 slices shown]
[im 12/35  brain]
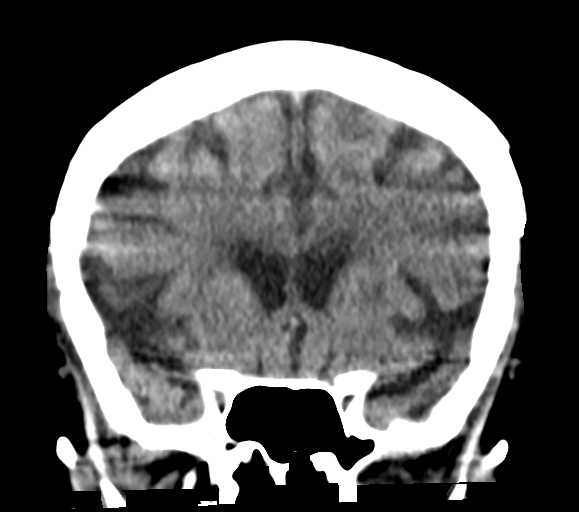
[im 16/35  brain]
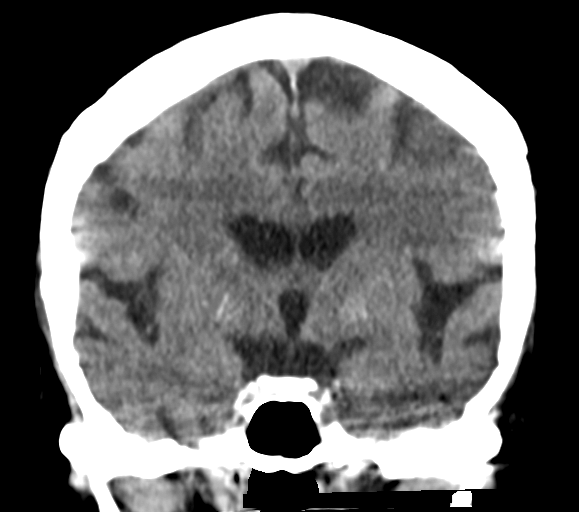
[im 19/35  brain]
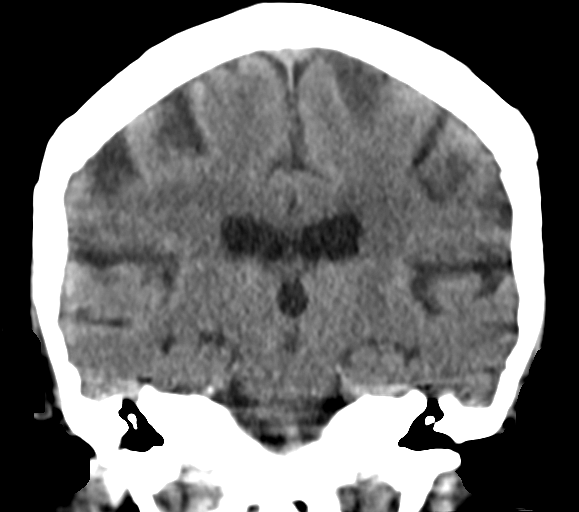

[Series 6: brain sag 5.00 hr40 s3 · sagittal · 0.28mm/px · 3 of 30 slices shown]
[im 10/30  brain]
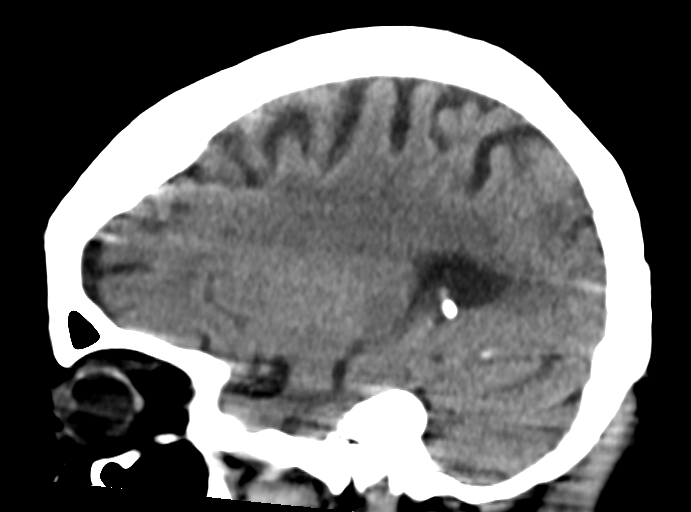
[im 15/30  brain]
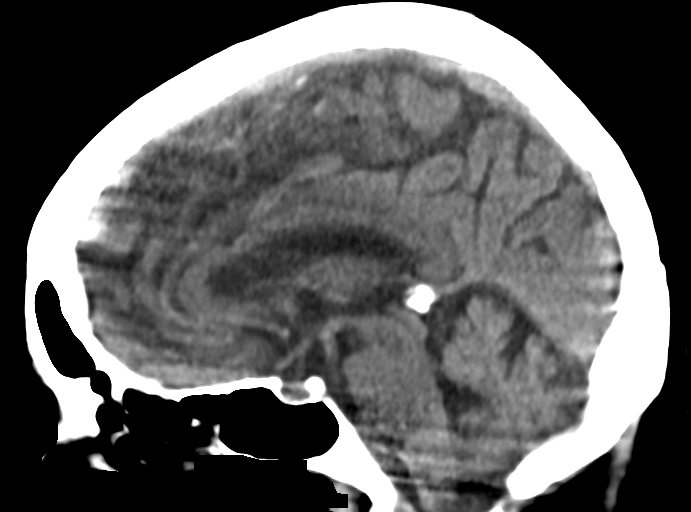
[im 20/30  brain]
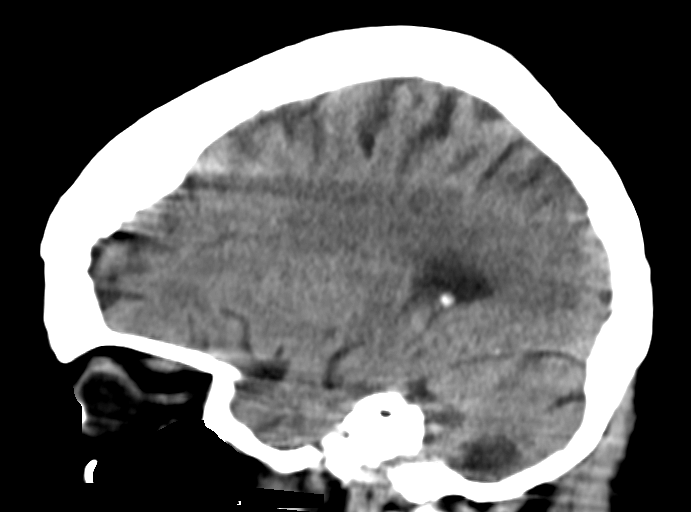

[Series 8: brain ax 2.00 hr60 s3 · axial · 0.34mm/px · z∈[-564,-458]mm · 8 of 65 slices shown, 10 images]
[im 7/65  brain]
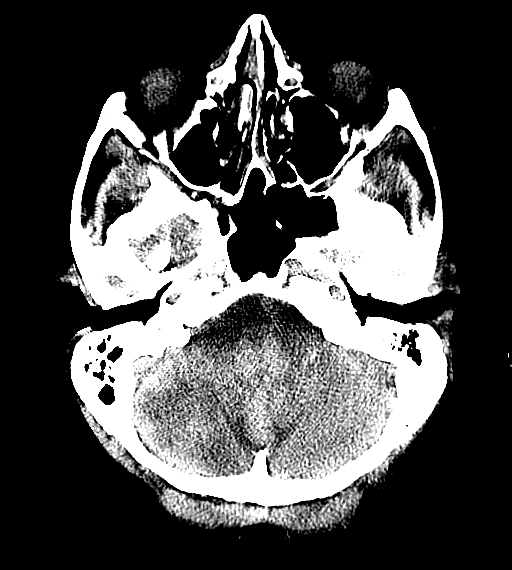
[im 7/65  bone]
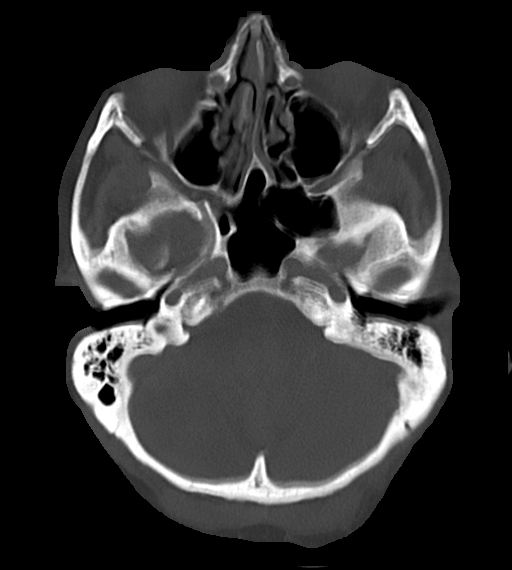
[im 13/65  brain]
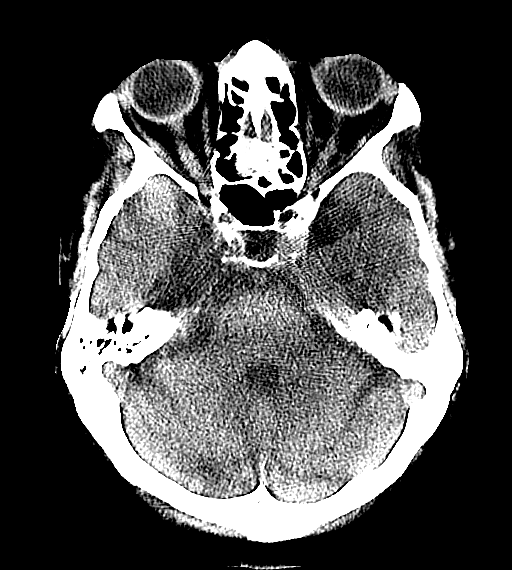
[im 20/65  brain]
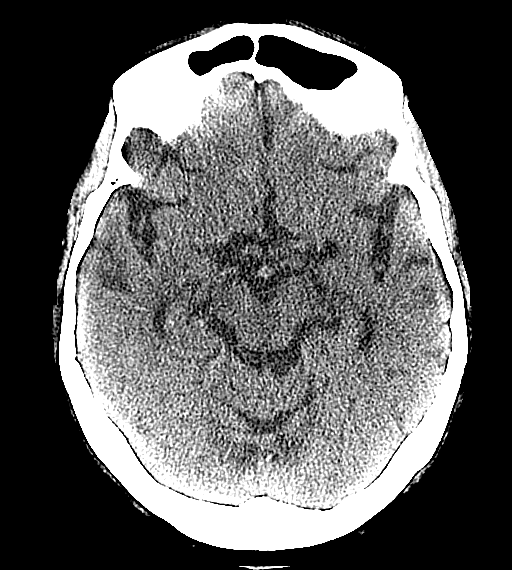
[im 29/65  brain]
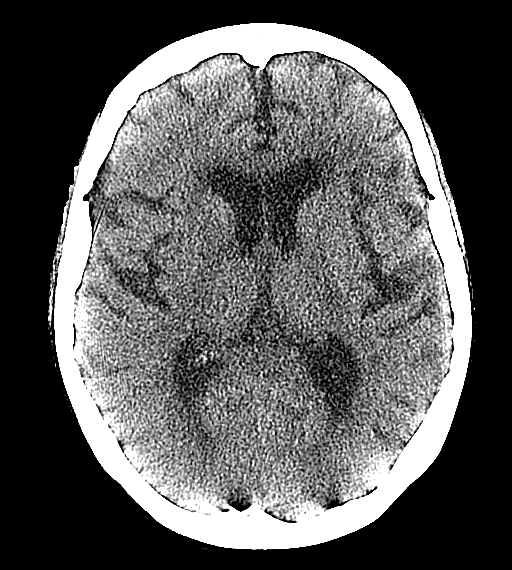
[im 36/65  brain]
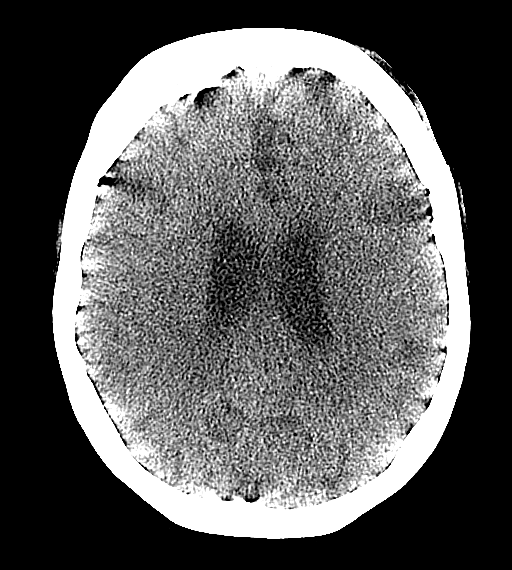
[im 36/65  bone]
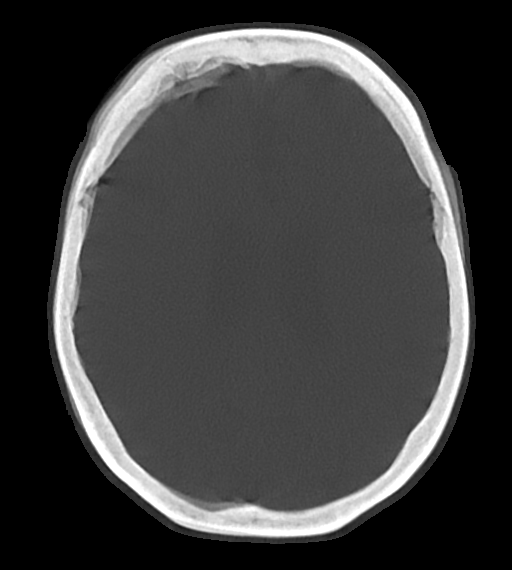
[im 45/65  brain]
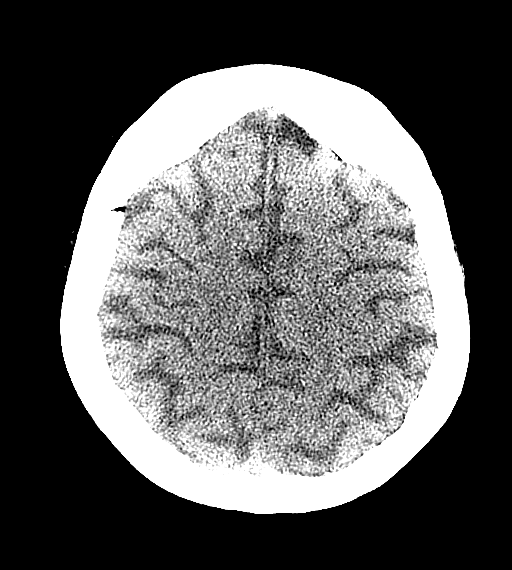
[im 52/65  brain]
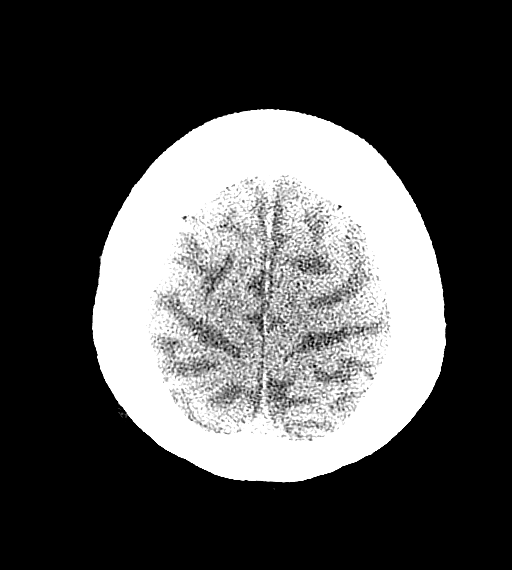
[im 58/65  brain]
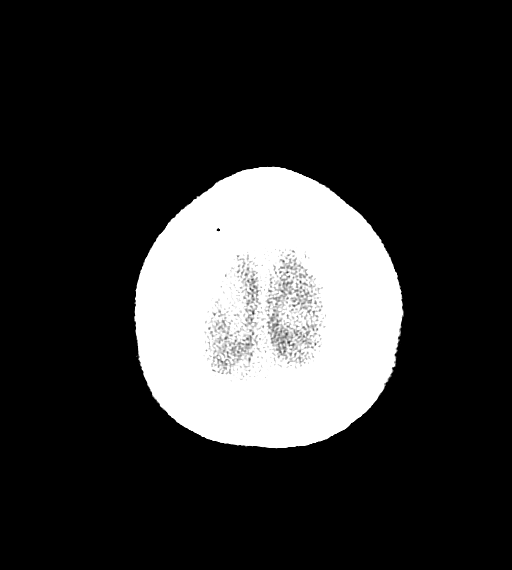

[14 of 47 positions shown; findings below may reference images not displayed]

Primary:     SALA, COCOROCO                                                        Date: 12/

DIAGNOSTIC STUDIES

EXAM

CT scan of the head without contrast

INDICATION

AMS
ALTERED STATUS. CONFUSION.   HB

TECHNIQUE

All CT scans at this facility use dose modulation, iterative reconstruction, and/or weight based
dosing when appropriate to reduce radiation dose to as low as reasonably achievable.

Number of previous computed tomography exams in the last 12 months is 1.

Number of previous nuclear medicine myocardial perfusion studies in the last 12 months is 0  .

COMPARISONS

August 20, 2021

FINDINGS

Old infarct involving the right cerebellar hemisphere is seen. Physiologic calcification is noted
within the basal ganglia. There is significant motion which limits the study. Suggestion of loss of
gray-white junction the left parietal lobe likely is artifactual. If symptoms persist short-term
follow-up exam may be necessary. No intracranial mass or hemorrhage is seen.

IMPRESSION

No intracranial mass or hemorrhage.

Old right cerebellar infarcts.

Motion degraded exam likely accounting for poor gray-white differentiation in the left parietal
lobe. If there are persistent symptoms, short-term follow-up exam may be necessary.

Tech Notes:

ALTERED STATUS. CONFUSION.
HB

## 2021-11-29 IMAGING — CT AGNECK
4 of 5 series · 15 of 35 positions shown, 17 images · non-contrast
Comparison: none

[Series 503: cta brain and/or neck cor 3.00 bv36 s3 · coronal · 0.53mm/px · 3 of 87 slices shown]
[im 18/87  bone]
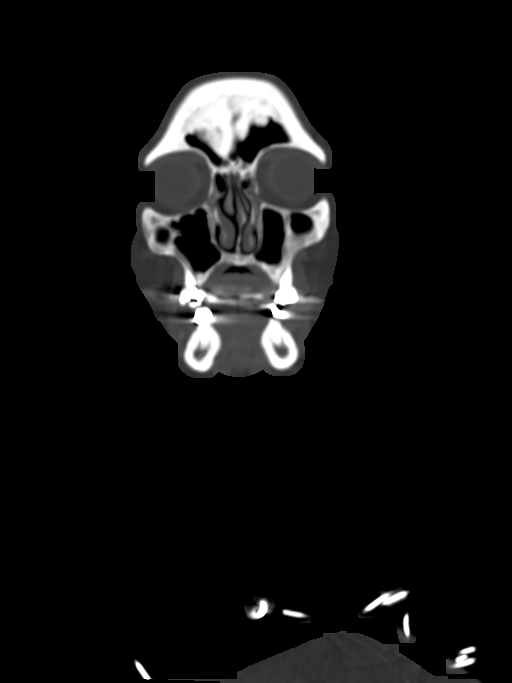
[im 35/87  bone]
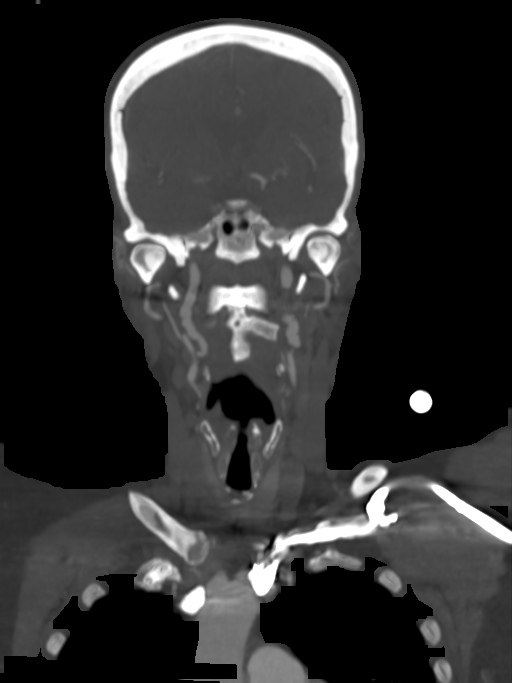
[im 52/87  bone]
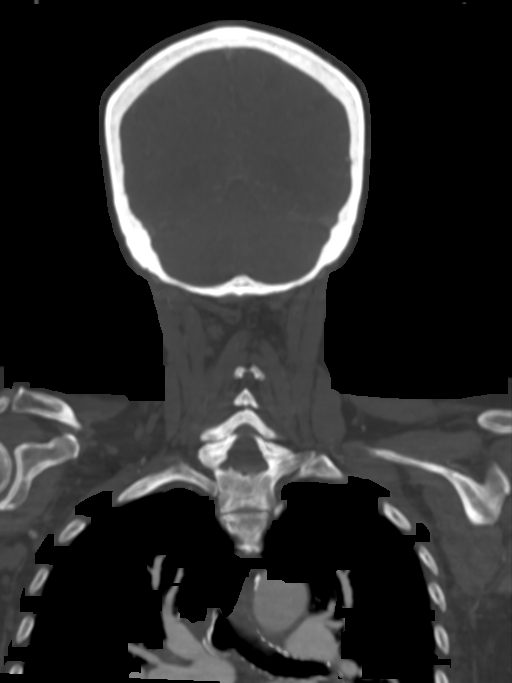

[Series 504: cta brain and/or neck sag 3.00 bv36 s3 · sagittal · 0.54mm/px · 5 of 90 slices shown, 6 images]
[im 30/90  bone]
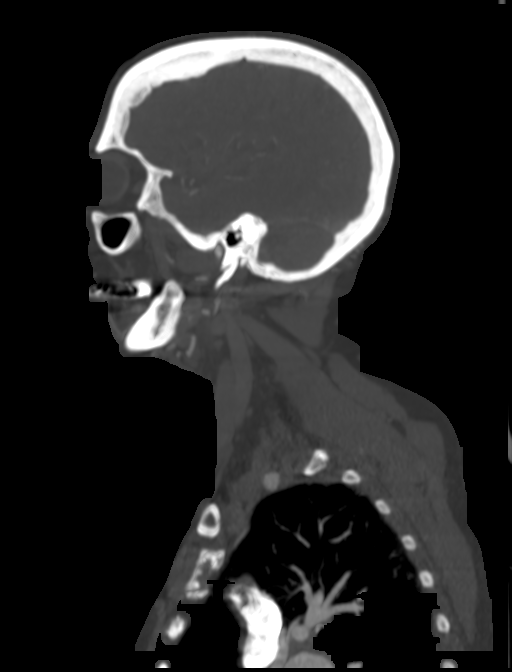
[im 38/90  bone]
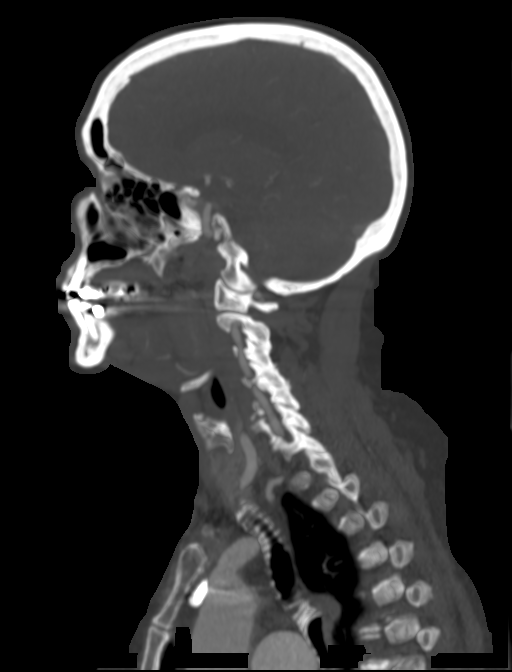
[im 45/90  soft-tissue]
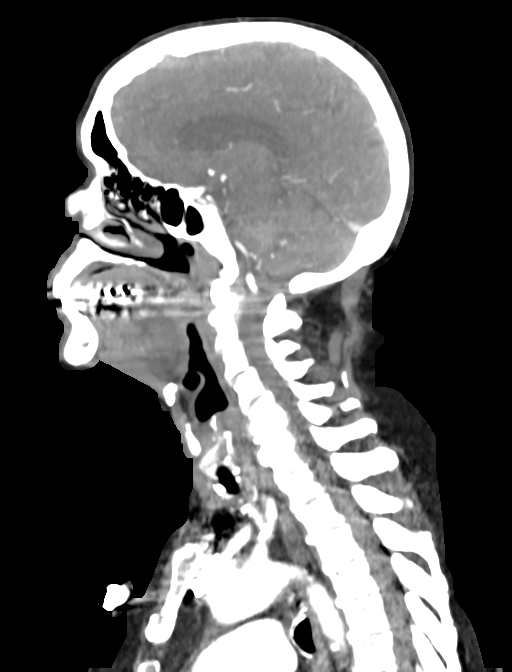
[im 45/90  bone]
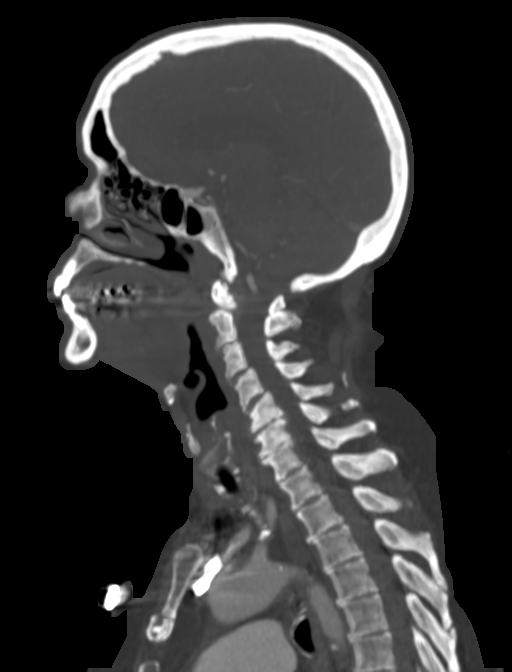
[im 52/90  bone]
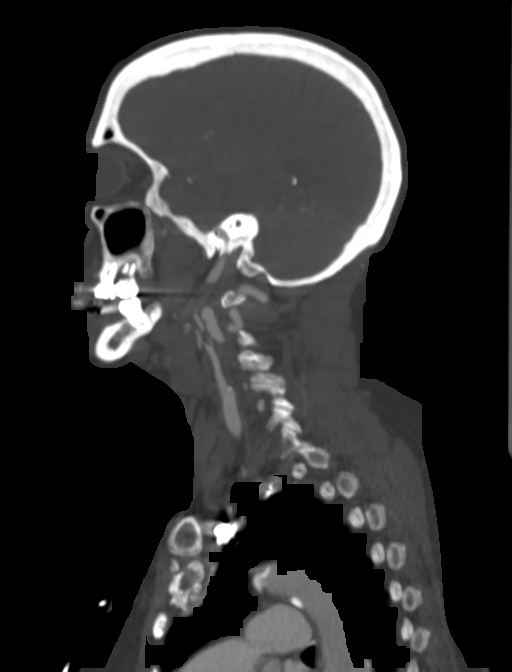
[im 60/90  bone]
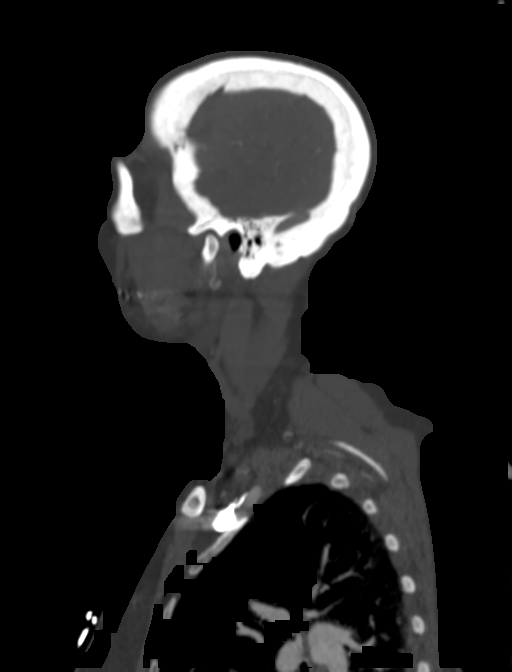

[Series 505: cta brain and/or neck 1.00 bv36 s3 · axial · 0.53mm/px · z∈[-680,-459]mm · 4 of 446 slices shown, 5 images (1 of 2)]
[im 90/446  soft-tissue]
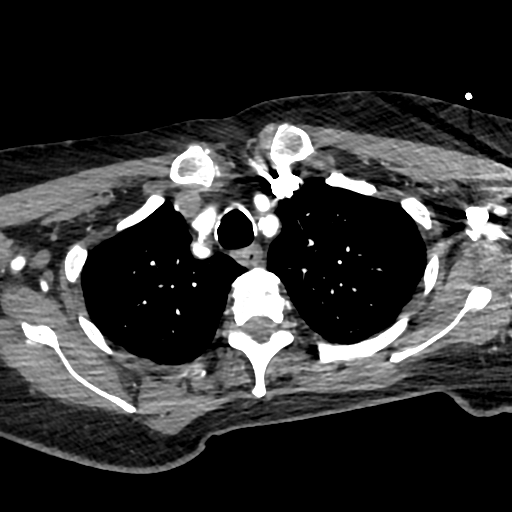
[im 90/446  bone]
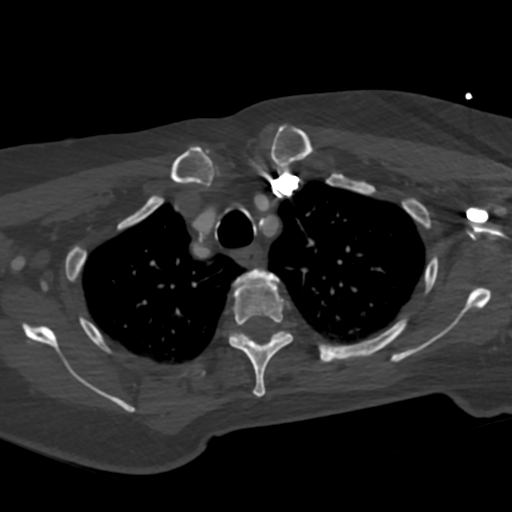
[im 179/446  bone]
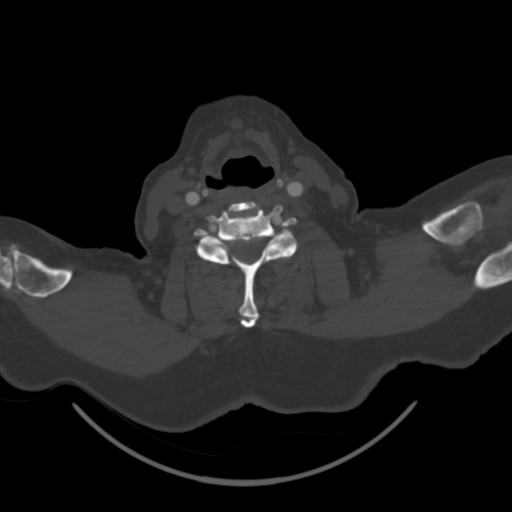
[im 268/446  bone]
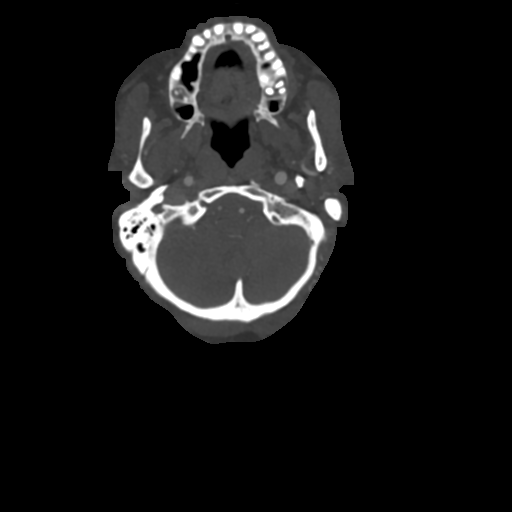
[im 357/446  bone]
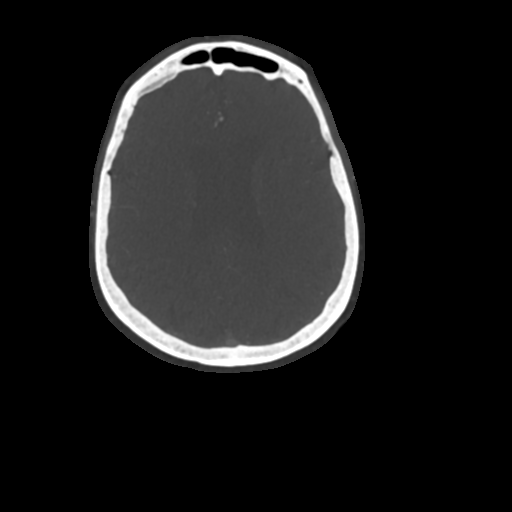

[Series 507: cta brain and/or neck 1.00 bv36 s3 · axial · 0.59mm/px · z∈[-680,-535]mm · 3 of 446 slices shown (2 of 2)]
[im 90/446  bone]
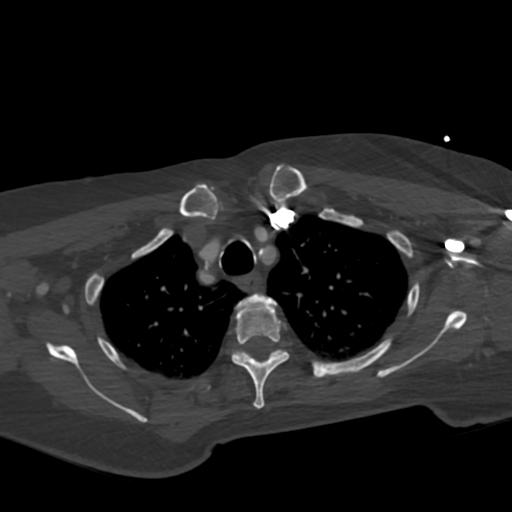
[im 179/446  bone]
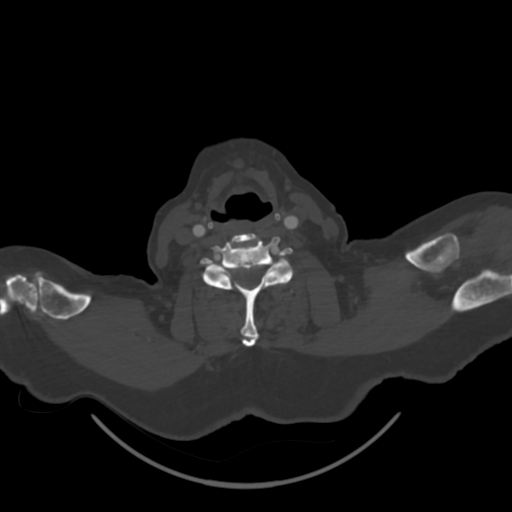
[im 268/446  bone]
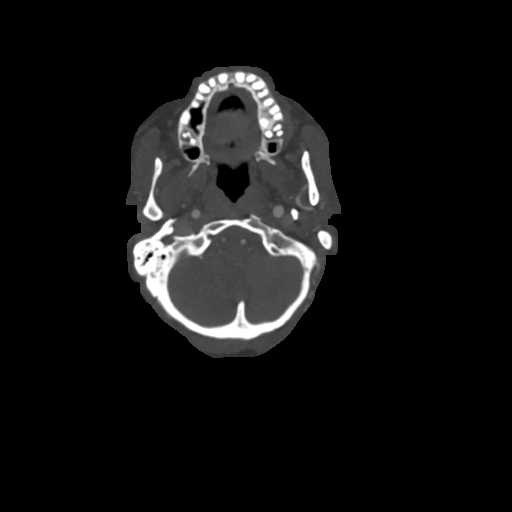

[15 of 35 positions shown; findings below may reference images not displayed]

Primary:     SAMBA, MYRON                                                        Date: 12/

DIAGNOSTIC STUDIES

EXAM

CT angiogram of the head neck with IV contrast

INDICATION

Stroke
stroke , ams

TECHNIQUE

All CT scans at this facility use dose modulation, iterative reconstruction, and/or weight based
dosing when appropriate to reduce radiation dose to as low as reasonably achievable. 3D reformats

Number of previous computed tomography exams in the last 12 months is 0  .

Number of previous nuclear medicine myocardial perfusion studies in the last 12 months is 0  .

COMPARISONS

None available.

FINDINGS

CTA HEAD:

Bilateral ACA, MCA, and PCA are patent.

CTA NECK:

Mild plaque of the aortic arch and proximal vessels. Bilateral vertebral arteries are patent.

Right common carotid and ICA demonstrate mild plaque. No stenosis by NASCET criteria.

Minimal plaque in the left carotid bulb. No stenosis by NASCET criteria.

No focal consolidation in the lung apices. No acute osseous findings. Degenerative disc disease in
the cervical spine.

IMPRESSION

No hemodynamically significant stenosis, occlusion, or dilatation in the head or neck.

Tech Notes:

stroke , ams

## 2021-11-30 ENCOUNTER — Ambulatory Visit: Admit: 2021-11-30 | Discharge: 2021-11-30 | Payer: MEDICARE

## 2021-11-30 ENCOUNTER — Encounter: Admit: 2021-11-30 | Discharge: 2021-11-30 | Payer: MEDICARE

## 2021-11-30 DIAGNOSIS — R6510 Systemic inflammatory response syndrome (SIRS) of non-infectious origin without acute organ dysfunction: Secondary | ICD-10-CM

## 2021-11-30 DIAGNOSIS — Z8673 Personal history of transient ischemic attack (TIA), and cerebral infarction without residual deficits: Secondary | ICD-10-CM

## 2021-11-30 DIAGNOSIS — R4182 Altered mental status, unspecified: Secondary | ICD-10-CM

## 2021-11-30 IMAGING — US ECHOCOMCON
1 series · 12 of 24 positions shown · non-contrast
Comparison: none

[Series 1: us echo 2d, comp w/ contrast · 112 acquisitions, 12 frames shown]
[im 5/112]
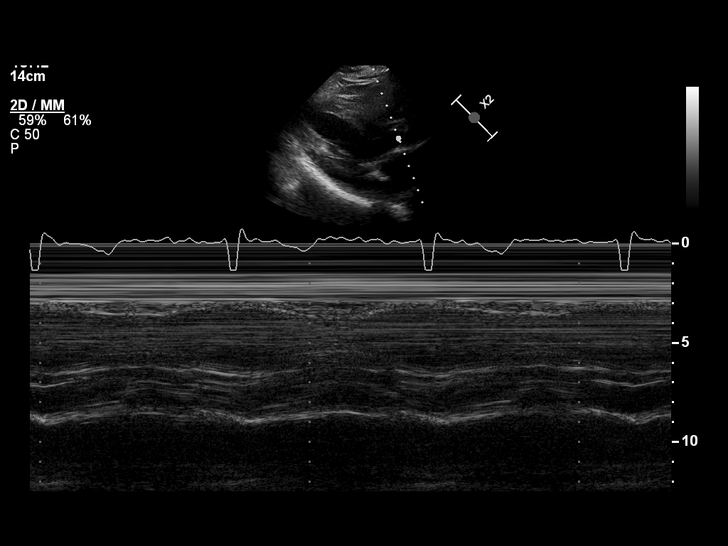
[im 15/112]
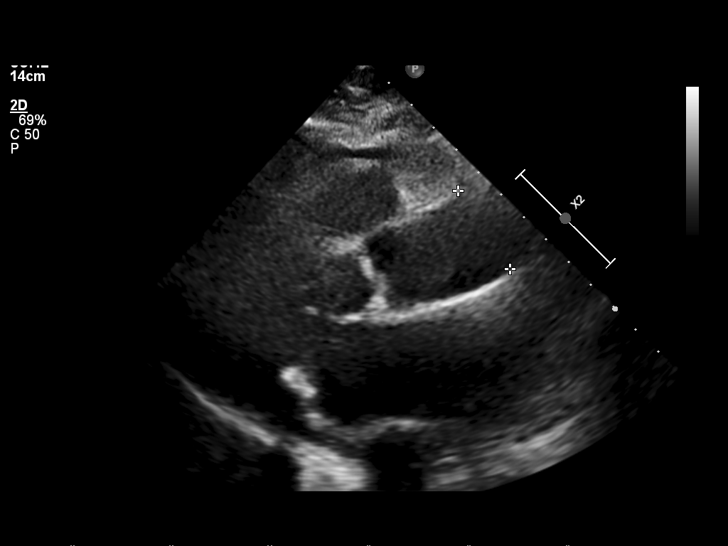
[im 25/112]
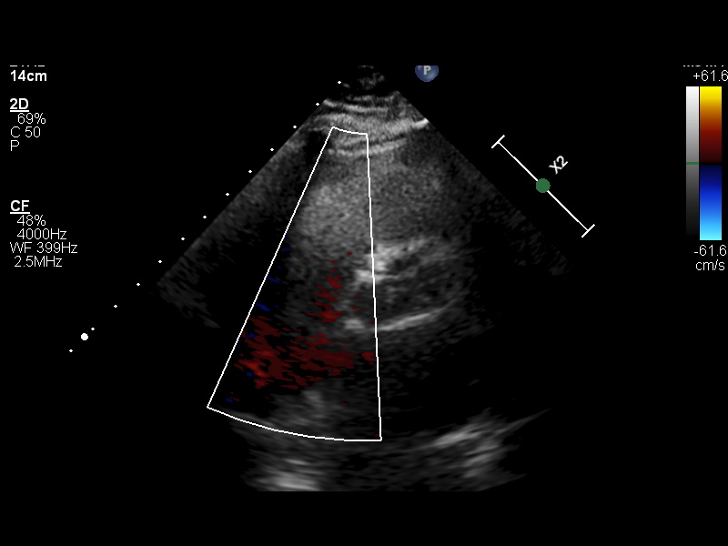
[im 34/112]
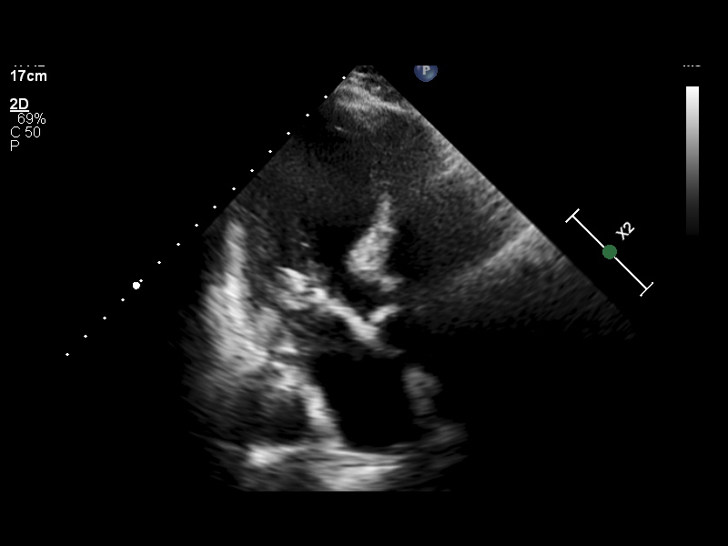
[im 49/112]
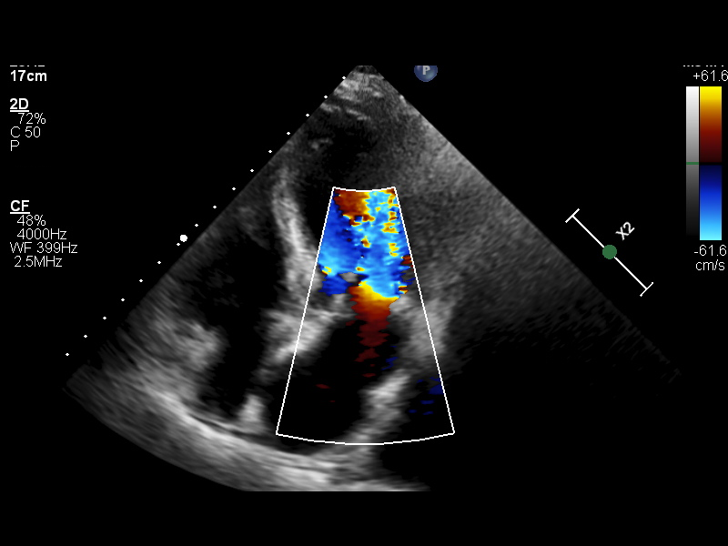
[im 58/112]
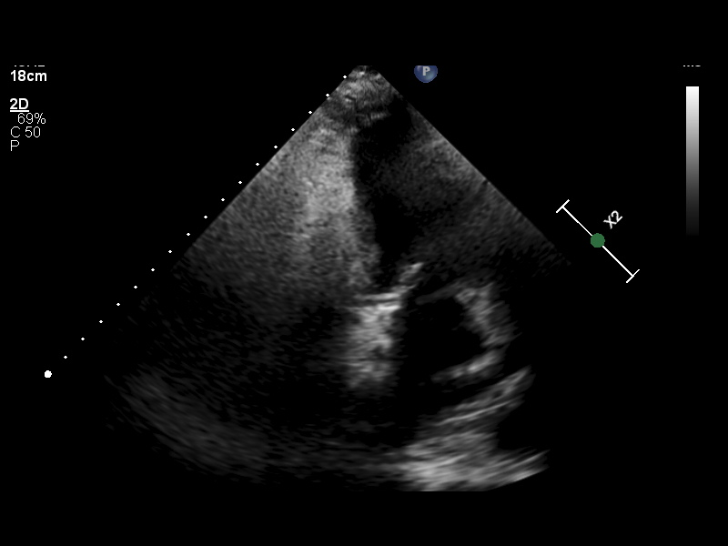
[im 68/112]
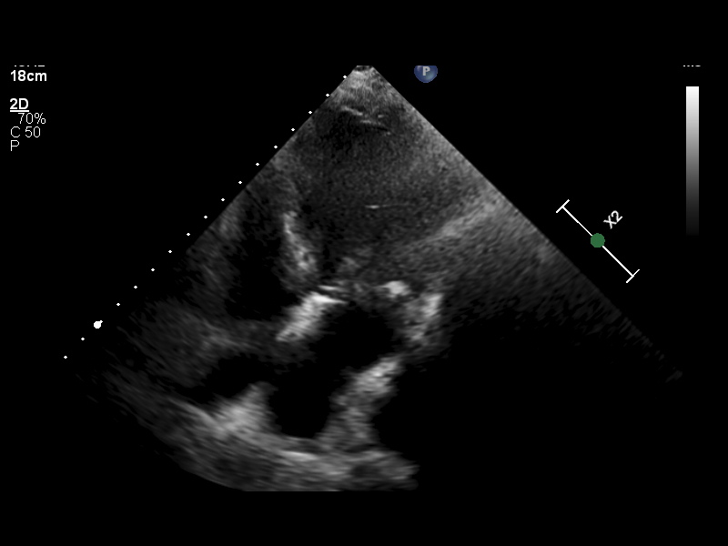
[im 73/112]
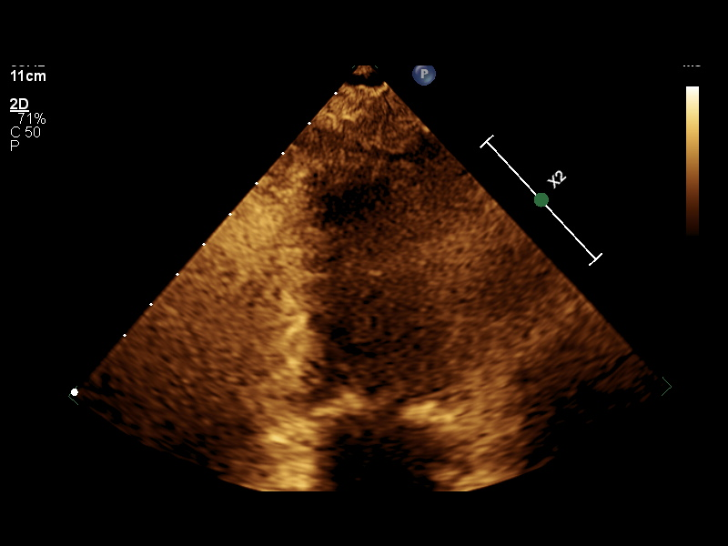
[im 87/112]
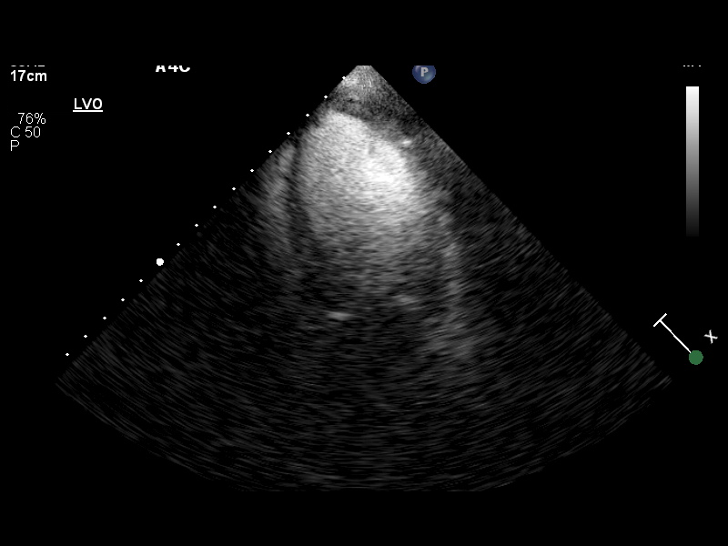
[im 92/112]
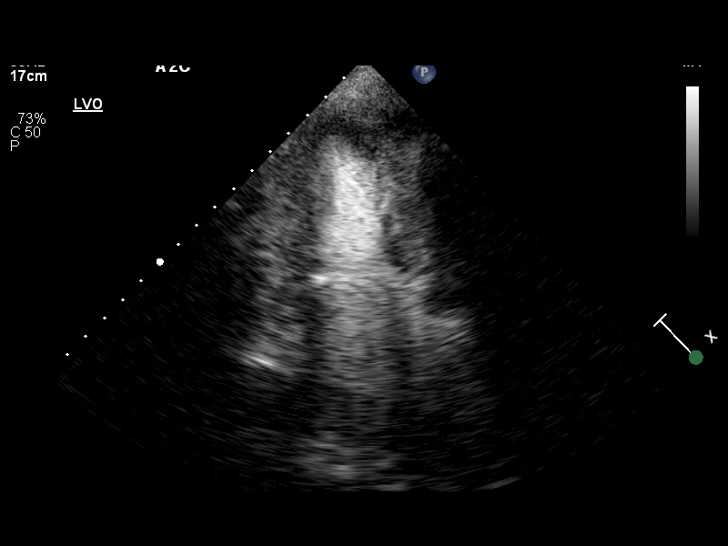
[im 102/112]
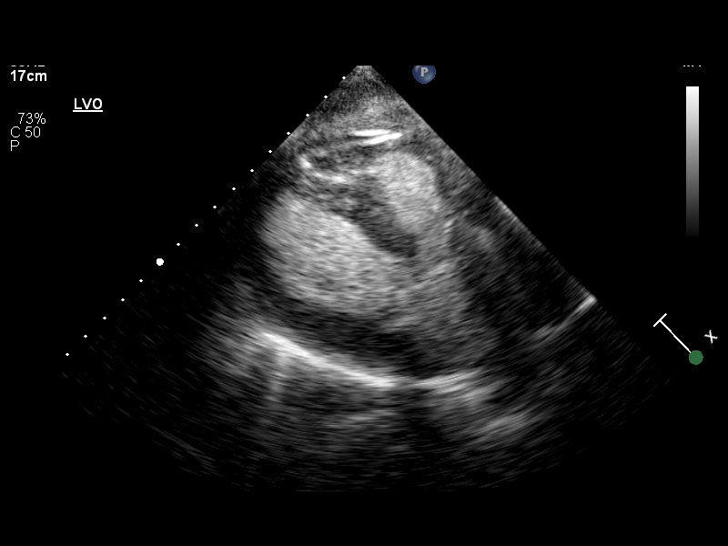
[im 112/112]
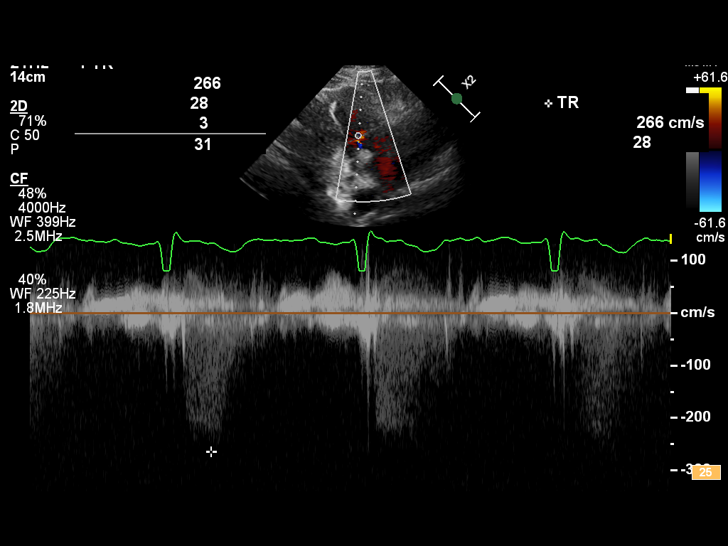

[12 of 24 positions shown; findings below may reference images not displayed]

Primary:     BLEECHING, KAMDON                                                        Date: [DATE]/

11/30/21 -  [DATE]D + DOPPLER ECHO
Location Performed: [HOSPITAL]

Referring Provider: Sew, Kwok Tai
Interpreting Physician: Grady, Jacoby
Yacef:
Location of Interp:
Sonographer: External Staff

[AMY-LEIGH]-Tran, Abimael

Indications: Stroke          Altered mental status

Definity contrast was given to enhance imaging. Technically difficult study with poor endocardial
visualization.

Vitals
Height   Weight   BSA (Calculated)   BP   Comments
160 cm (5' 3")   77.1 kg (170 lb)   1.85   146/82

Interpretation Summary
The left ventricular size is normal. Mild concentric hypertrophy. The visually estimated ejection
fraction is 65%. The ejection fraction by Simpson's biplane method is 70%. There are no segmental
wall motion abnormalities. Grade I (mild) left ventricular diastolic dysfunction.
The right ventricle is probably normal in size. The right ventricular wall thickness is normal. The
right ventricular systolic function is normal. PASP is estimated at 32 mmHg.
Left Atrium: There is no interatrial shunting by color flow Doppler and saline contrast studies.
Mitral Valve: Non-specific thickening. Mild regurgitation. There is moderate mitral annular
calcification without stenosis.
The aortic root and ascending aorta are normal in size.
No pericardial effusion.
Compared to study 02/07/21

Echocardiographic Findings
Left Ventricle   The left ventricular size is normal. Mild concentric hypertrophy. The visually
estimated ejection fraction is 65%. The ejection fraction by Simpson's biplane method is 70%. There
are no segmental wall motion abnormalities. Grade I (mild) left ventricular diastolic dysfunction.
Right Ventricle   The right ventricle is probably normal in size. The right ventricular wall
thickness is normal. The right ventricular systolic function is normal. PASP is estimated at 32
mmHg.
Left Atrium   There is no interatrial shunting by color flow Doppler and saline contrast studies.
Right Atrium   Normal size.
IVC/SVC   Normal central venous pressure (0-5 mm Hg).
Mitral Valve   Non-specific thickening. Mild regurgitation. There is moderate mitral annular
calcification without stenosis.
Tricuspid Valve   Normal valve structure. No stenosis. Mild regurgitation.
Aortic Valve   The valve has focal thickening and is sclerotic. No stenosis. No regurgitation.
Pulmonary   The pulmonic valve was not seen well but no Doppler evidence of stenosis.
Aorta   The aortic root and ascending aorta are normal in size.
Pericardium   No pericardial effusion.

Left Ventricular Wall Scoring
Resting   Score Index: 1.000   Percent Normal: 100.0%

The left ventricular wall motion is normal.

Left Heart 2D Measurements (Normal Ranges)
EF (Visual)
65 %
EF (Simpson's)
70 %
LVIDD
4.3 cm  (Range: 3.8 - 5.2)
LVIDS
2.5 cm  (Range: 2.2 - 3.5)
IVS
1.1 cm  (Range: 0.6 - 0.9)
LV PW
1.1 cm  (Range: 0.6 - 0.9)
LA Size
3.7 cm  (Range: 2.7 - 3.8)

Right Heart 2D   M-Mode Measurements (Normal Ranges) (Range)
RV Basal Dia
4.2 cm  (2.5 - 4.1)
RV Mid Dia
2.8 cm  (1.9 - 3.5)
SAYAGO
17.2 cm2  (<18)
M-Mode TAPSE
2.4 cm  (>1.7)

Left Heart 2D Addnl Measurements (Normal Ranges)
LA Vol
63 mL  (Range: 22 - 52)
LA Vol Index
34.05 mL/m2  (Range: 16 - 34)
LV Mass
163 g  (Range: 67 - 162)
LV Mass Index
88 g/m2  (Range: 43 - 95)
RWT
0.51  (Range: <=0.42)

Aortic Root Measurements (Normal Ranges)
Sinus
2.8 cm  (Range: 2.4 - 3.6)
JUELLSON RUSSINHO
3.0 cm

Doppler (Spectral and Color Flow)
Estimated Peak Systolic PA Pressure
Aortic valve area
1.70 cm2
Aortic valve mean gradient
Aortic valve peak gradient
Aortic valve peak velocity
2.3 m/s
Aortic valve velocity ratio

Tech Notes:

definity given lot #[id number]

## 2021-11-30 IMAGING — MR Head^Brain
8 series · 40 of 48 positions shown · non-contrast
Comparison: none

[Series 4: T1 · sagittal · 5.0mm · 0.45mm/px · 5 of 19 slices shown (1 of 2)]
[im 1/19]
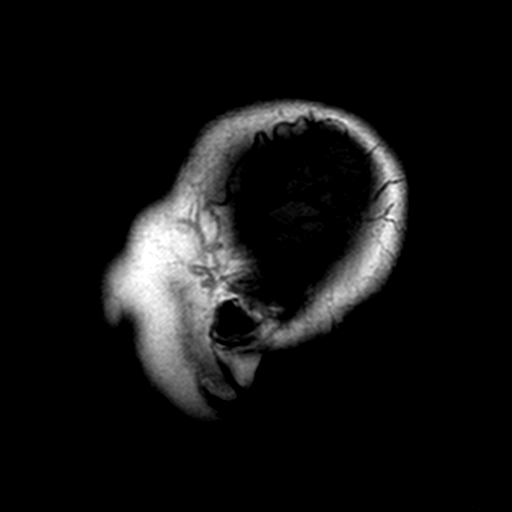
[im 5/19]
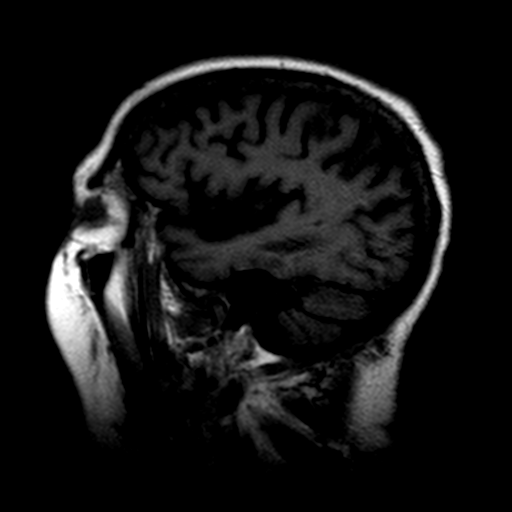
[im 10/19]
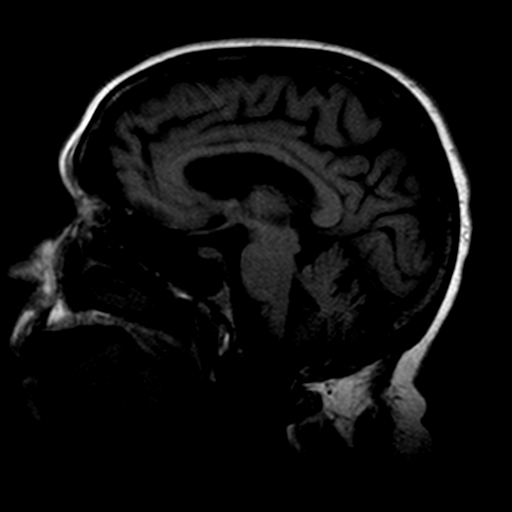
[im 14/19]
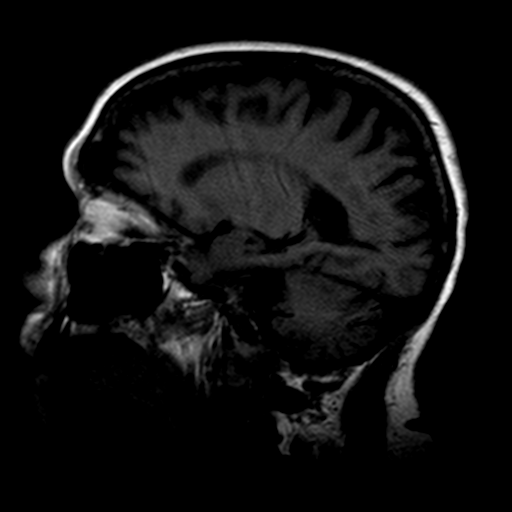
[im 19/19]
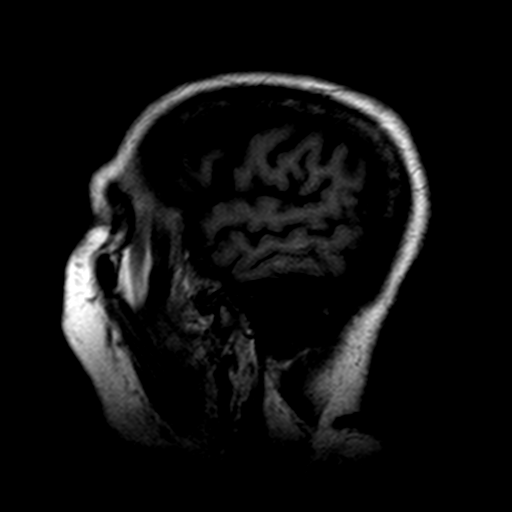

[Series 8: DWI · axial · 5.0mm · 1.80mm/px · z∈[-66,+60]mm · 9 of 56 slices shown (1 of 2)]
[im 1/56]
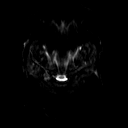
[im 10/56]
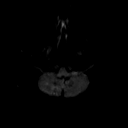
[im 19/56]
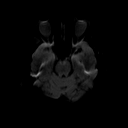
[im 23/56]
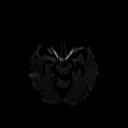
[im 28/56]
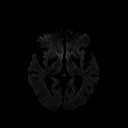
[im 33/56]
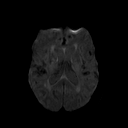
[im 37/56]
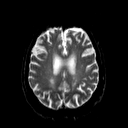
[im 46/56]
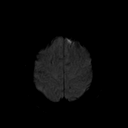
[im 56/56]
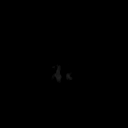

[Series 9: DWI · axial · 5.0mm · 1.80mm/px · z∈[-66,+60]mm · 5 of 21 slices shown (2 of 2)]
[im 1/21]
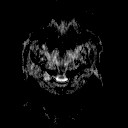
[im 6/21]
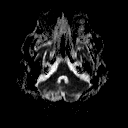
[im 11/21]
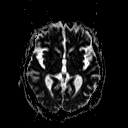
[im 16/21]
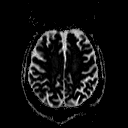
[im 21/21]
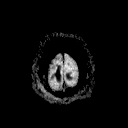

[Series 10: FLAIR · axial · 5.0mm · 0.45mm/px · z∈[-80,+66]mm · 5 of 24 slices shown]
[im 1/24]
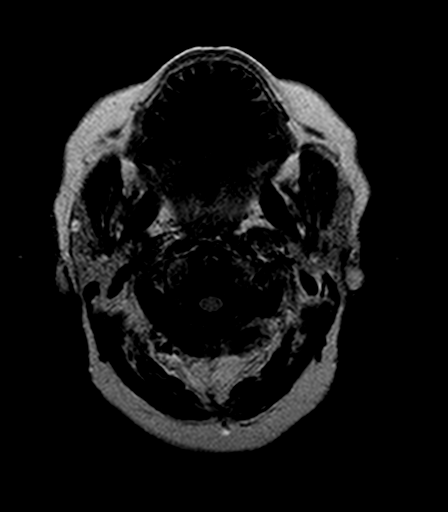
[im 6/24]
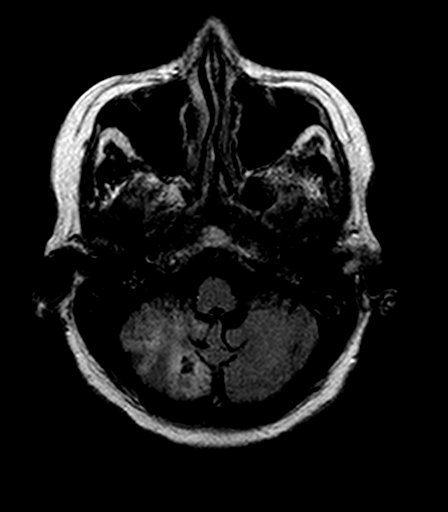
[im 12/24]
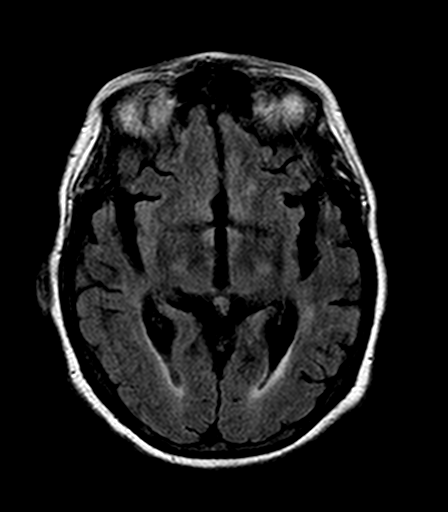
[im 18/24]
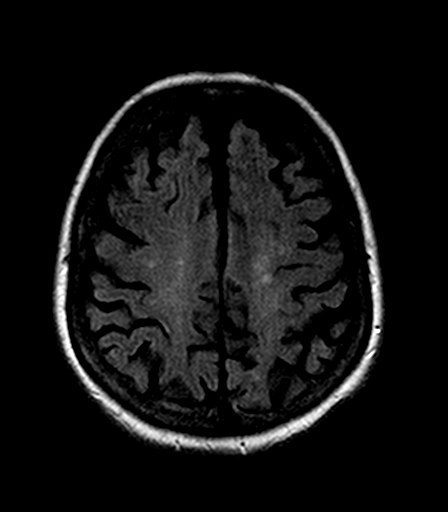
[im 24/24]
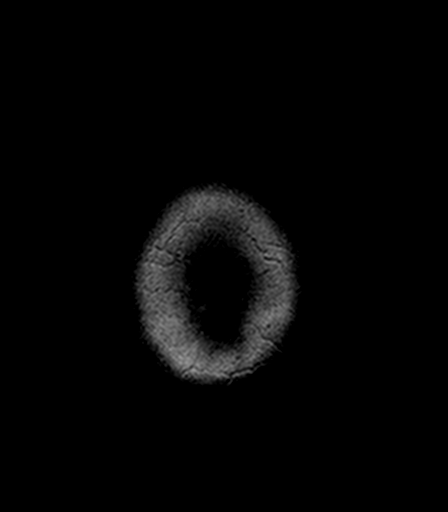

[Series 11: T2 · axial · 5.0mm · 0.72mm/px · z∈[-80,+66]mm · 5 of 24 slices shown (1 of 2)]
[im 1/24]
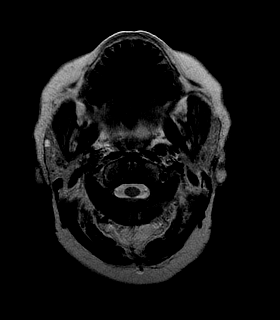
[im 6/24]
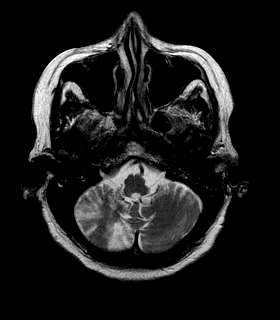
[im 12/24]
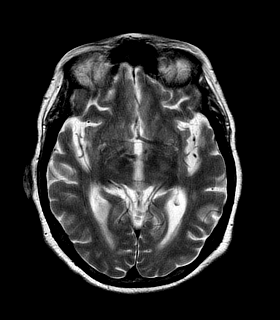
[im 18/24]
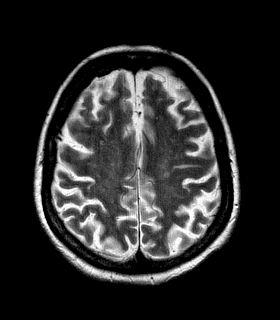
[im 24/24]
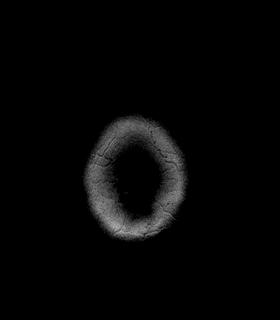

[Series 12: T1 · axial · 5.0mm · 0.45mm/px · z∈[-80,+66]mm · 5 of 24 slices shown (2 of 2)]
[im 1/24]
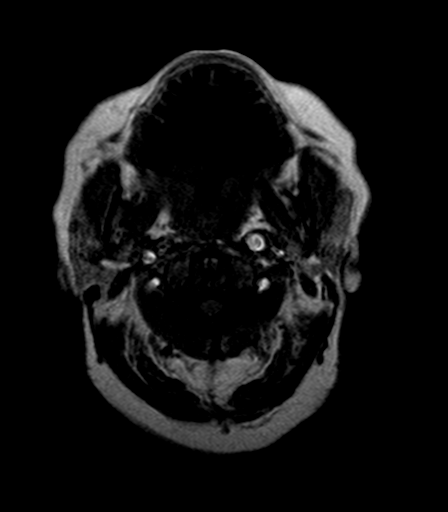
[im 6/24]
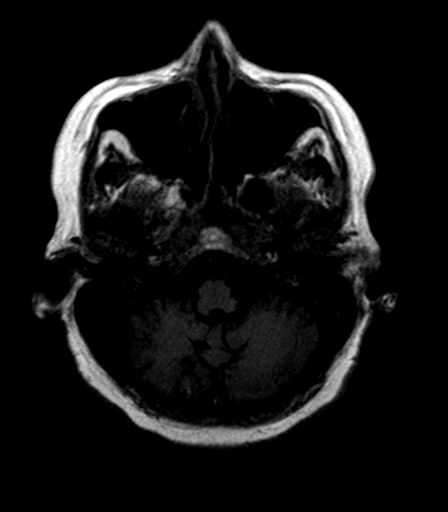
[im 12/24]
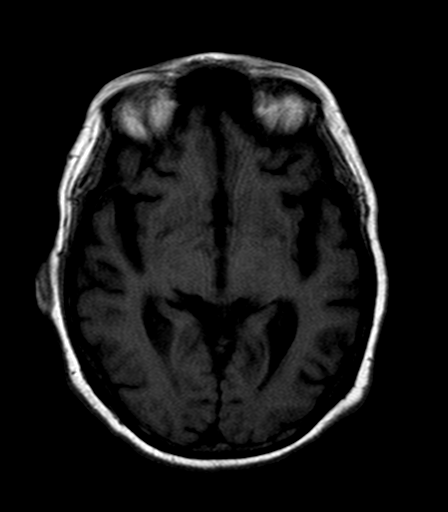
[im 18/24]
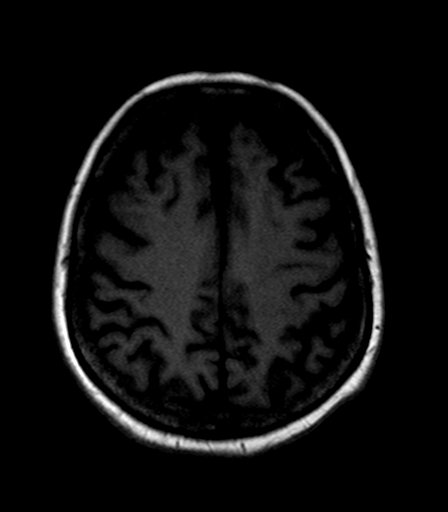
[im 24/24]
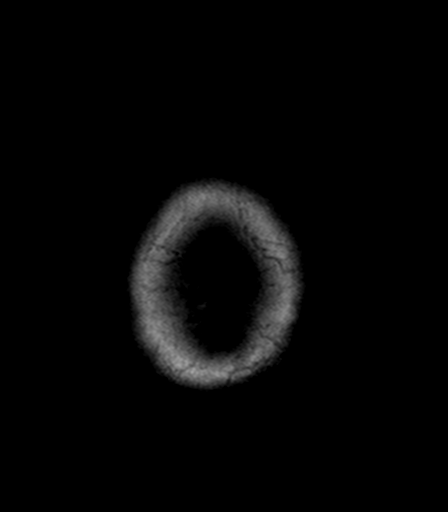

[Series 14: axial blood · axial · 5.0mm · 0.45mm/px · 1 of 23 slices shown]
[im 1/23]
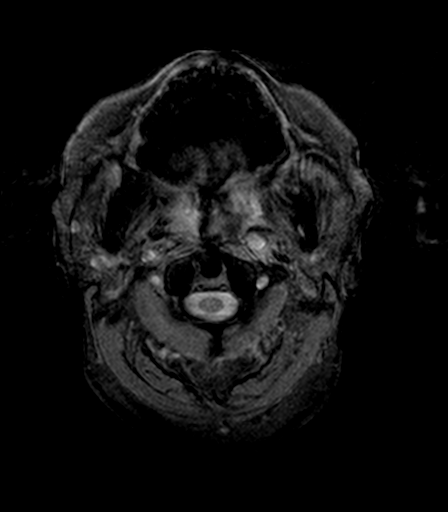

[Series 18: T2 · coronal · 5.0mm · 0.69mm/px · 5 of 23 slices shown (2 of 2)]
[im 1/23]
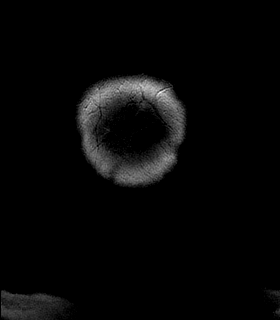
[im 6/23]
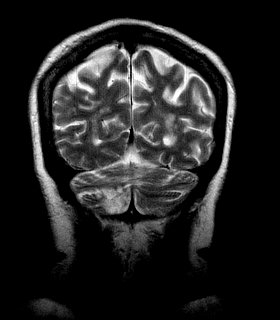
[im 12/23]
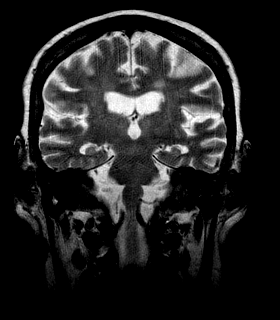
[im 17/23]
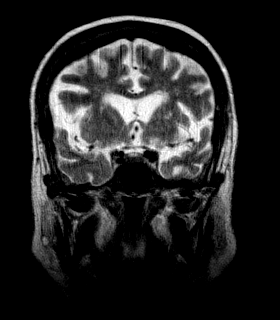
[im 23/23]
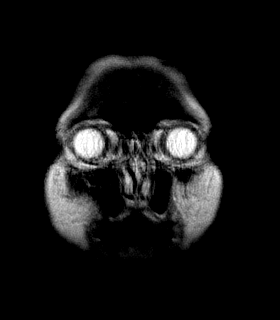

[40 of 48 positions shown; findings below may reference images not displayed]

Primary:     DOAN, YANDY                                                        Date: 12/

EXAM

MR head/brain wo con

INDICATION

Stroke
PT PRESENTS TO ER CONFUSED, INCOHERENT, NON RESPONSIVE.  POOR HISTORIAN, ACUTE ONSET PER SON.  RG

TECHNIQUE

Multi planar multi sequence MRI of the brain without IV contrast

COMPARISONS

None available

FINDINGS

No restricted diffusion to suggest acute ischemia. There is moderate chronic microvascular disease
and parenchymal volume loss. There is no mass effect or cerebral herniation. No evidence of
intracranial hemorrhage. Susceptibility in the basal ganglia consistent with calcifications. Right
cerebellar high T2 signal likely relates to encephalomalacia from old infarct, previously
hypodensity was seen in this region on August 20, 2021. Corpus callosum, pituitary, and pineal
are unremarkable.

IMPRESSION

No acute intracranial findings. Moderate chronic microvascular disease and parenchymal volume loss.
Old right cerebellar infarct.

Tech Notes:

PT PRESENTS TO ER CONFUSED, INCOHERENT, NON RESPONSIVE.  POOR HISTORIAN, ACUTE ONSET PER SON.  RG

## 2021-11-30 IMAGING — MR Head^Brain
1 series · 48 of 48 positions shown · non-contrast
Comparison: none

[Series 4: cow · axial · 0.8mm · 0.56mm/px · z∈[-77,-7]mm · 48 of 90 slices shown]
[im 1/90]
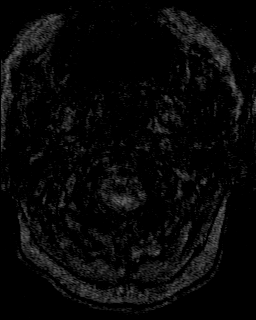
[im 2/90]
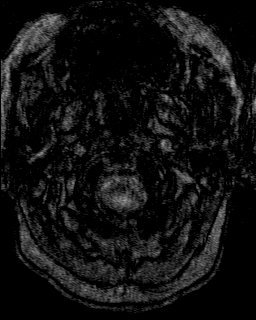
[im 4/90]
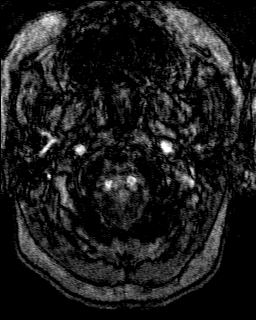
[im 6/90]
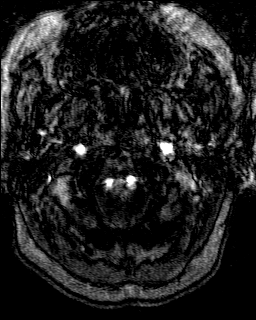
[im 8/90]
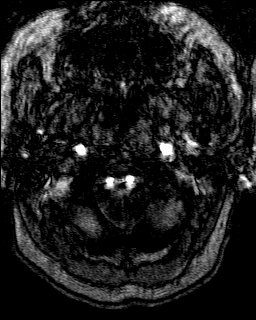
[im 10/90]
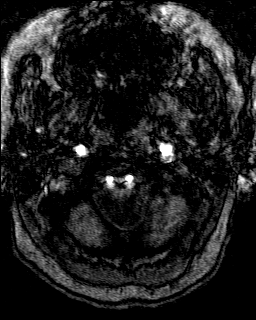
[im 12/90]
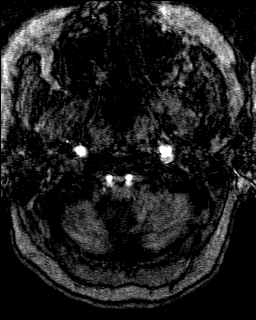
[im 14/90]
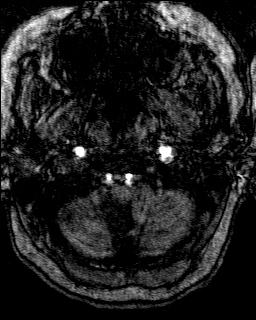
[im 16/90]
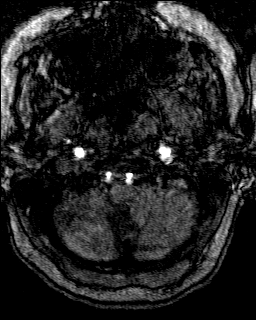
[im 18/90]
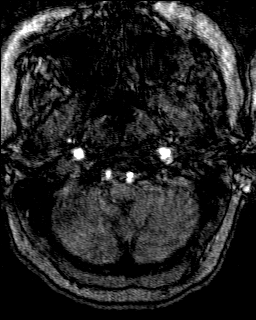
[im 19/90]
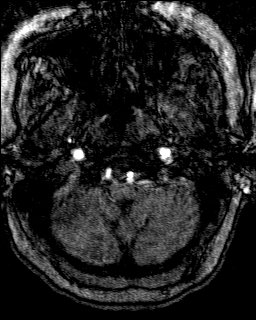
[im 21/90]
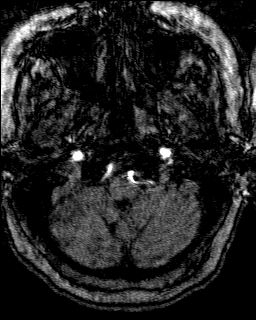
[im 23/90]
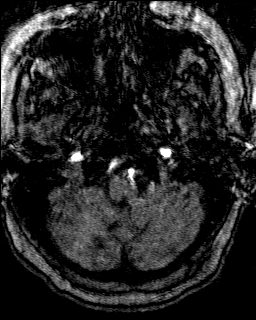
[im 25/90]
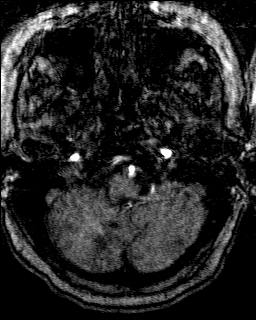
[im 27/90]
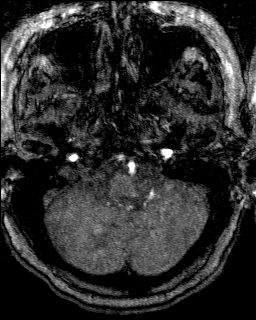
[im 29/90]
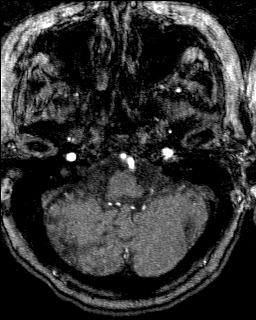
[im 31/90]
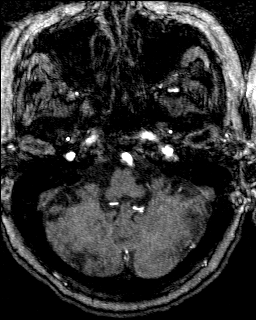
[im 33/90]
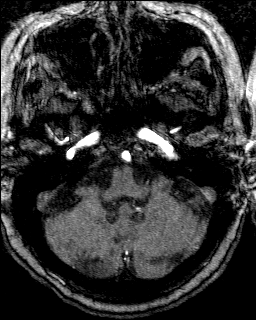
[im 35/90]
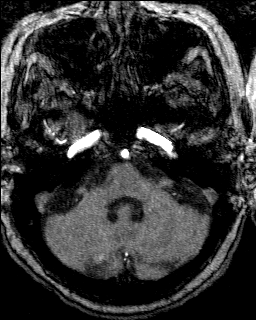
[im 36/90]
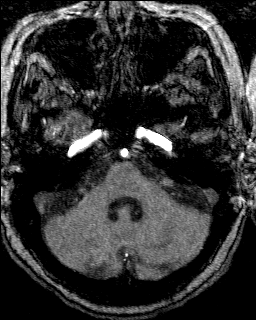
[im 38/90]
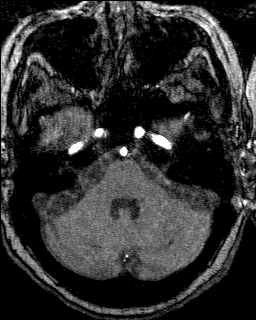
[im 40/90]
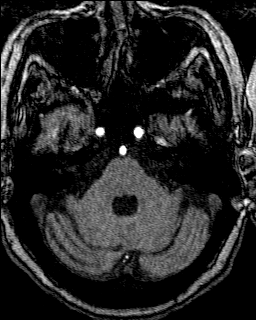
[im 42/90]
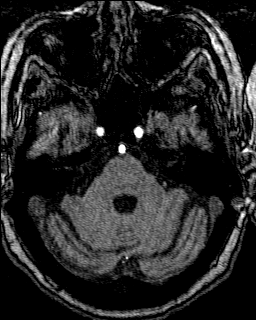
[im 44/90]
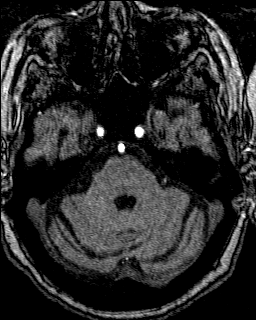
[im 46/90]
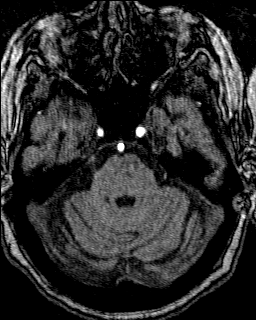
[im 48/90]
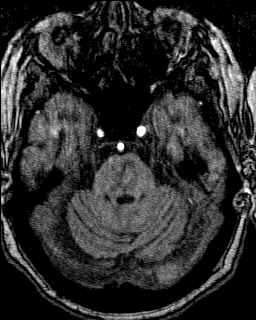
[im 50/90]
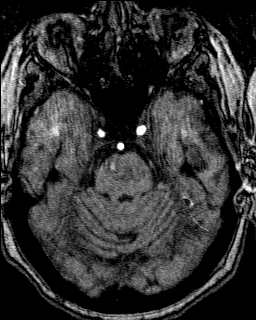
[im 52/90]
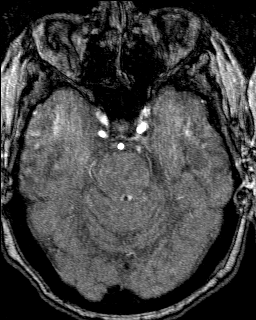
[im 54/90]
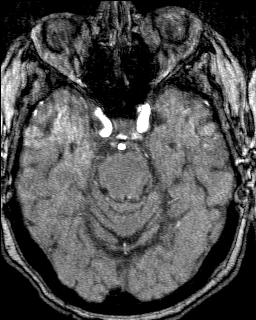
[im 55/90]
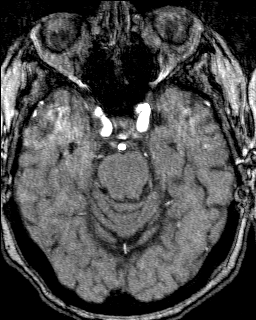
[im 57/90]
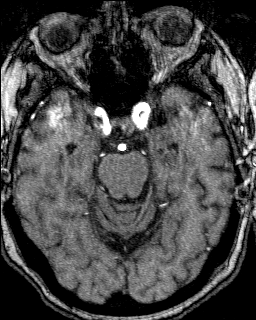
[im 59/90]
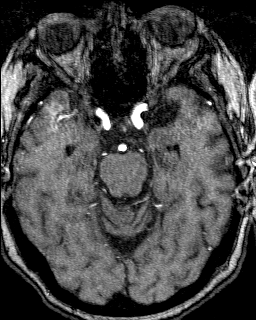
[im 61/90]
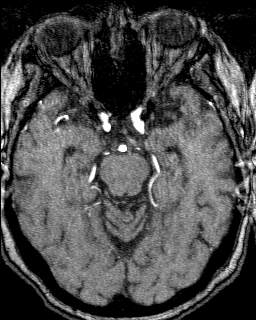
[im 63/90]
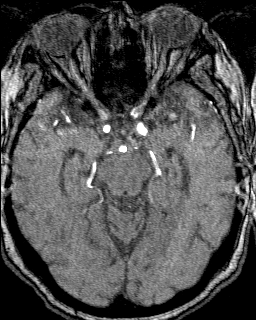
[im 65/90]
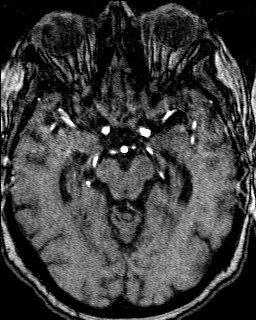
[im 67/90]
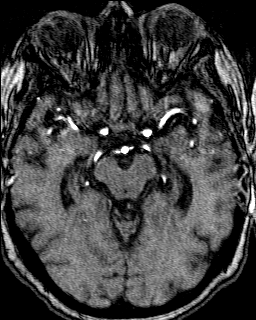
[im 69/90]
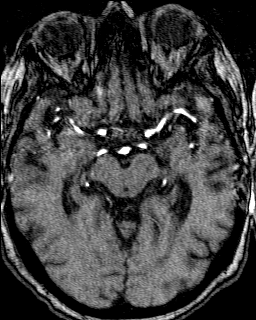
[im 71/90]
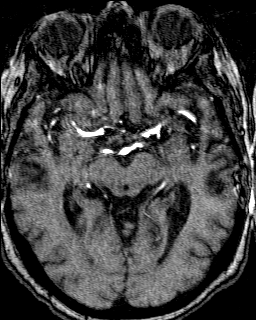
[im 72/90]
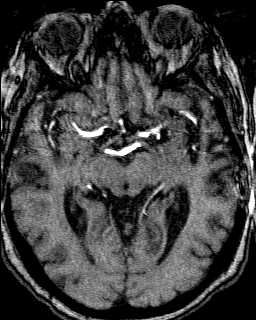
[im 74/90]
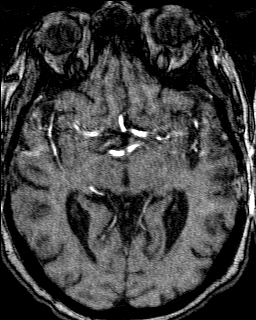
[im 76/90]
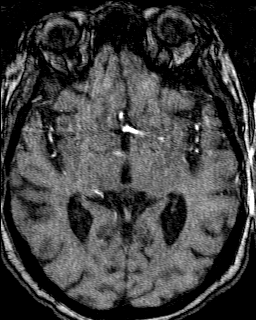
[im 78/90]
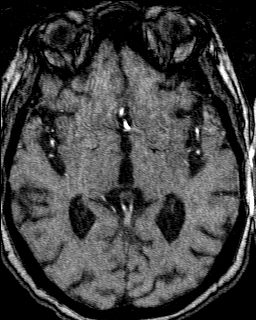
[im 80/90]
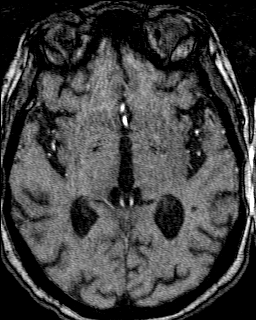
[im 82/90]
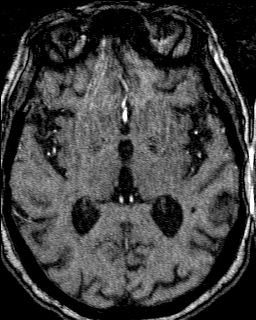
[im 84/90]
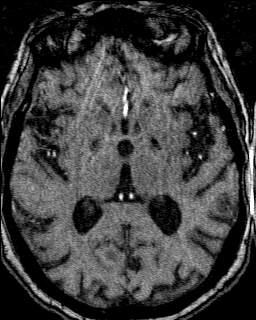
[im 86/90]
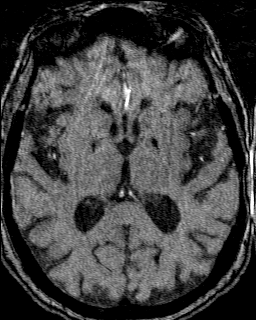
[im 88/90]
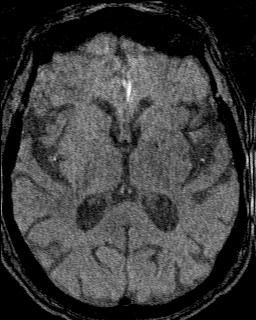
[im 90/90]
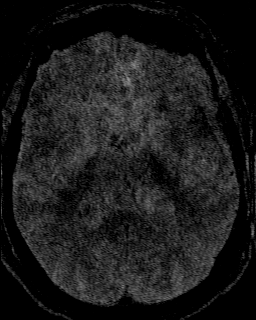

[48 of 48 positions shown; findings below may reference images not displayed]

Primary:     WOOLFOLK, LLOYD                                                        Date: 12/

EXAM

MR mra head wo con

INDICATION

Stroke
PRESENTS TO ER LAST NIGHT, CONFUSION, COMBATIVE, THEN NON RESPONSIVE.  ACUTE ONSET PER SON.  POOR
HISTORIAN, UNABLE TO ANSWER QUESTIONS.  RG

TECHNIQUE

MRA of the head without IV contrast, time-of-flight

COMPARISONS

Same-day CT

FINDINGS

Bilateral MCA, ACA, and PCA are patent.

IMPRESSION

No hemodynamically significant stenosis, occlusion, or dilatation.

Tech Notes:

PRESENTS TO ER LAST NIGHT, CONFUSION, COMBATIVE, THEN NON RESPONSIVE.  ACUTE ONSET PER SON.  POOR
HISTORIAN, UNABLE TO ANSWER QUESTIONS.  RG

## 2021-12-01 IMAGING — CT ABDOMEN_PELVIS WO(Adult)
2 of 3 series · 12 of 46 positions shown, 14 images · non-contrast
Comparison: none

[Series 2: abdomen_pelvis ax 3.00 br40 s3 · axial · 0.61mm/px · z∈[+1487,+1879]mm · 9 of 151 slices shown, 11 images]
[im 10/151  soft-tissue]
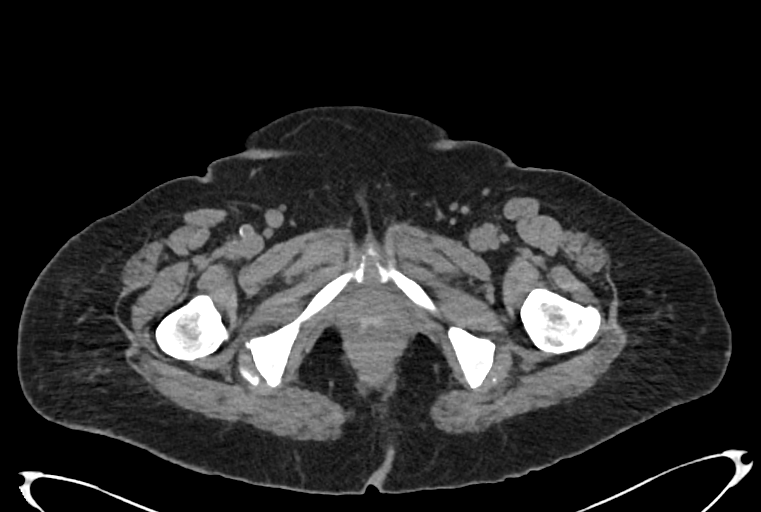
[im 10/151  bone]
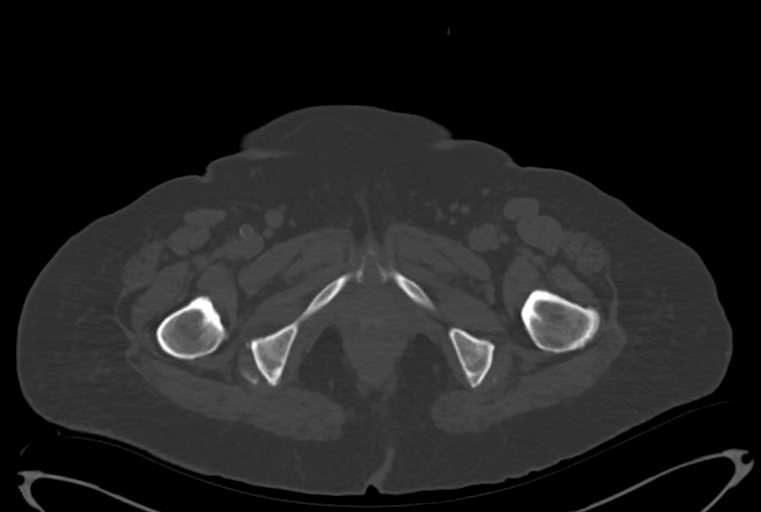
[im 30/151  soft-tissue]
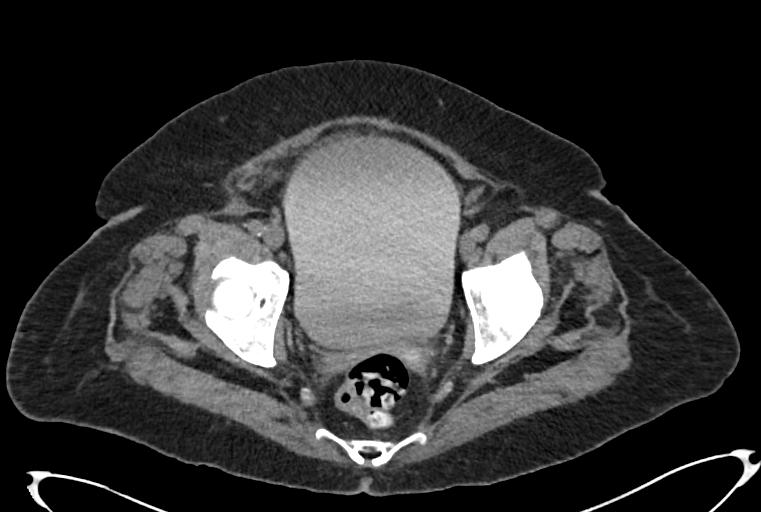
[im 44/151  soft-tissue]
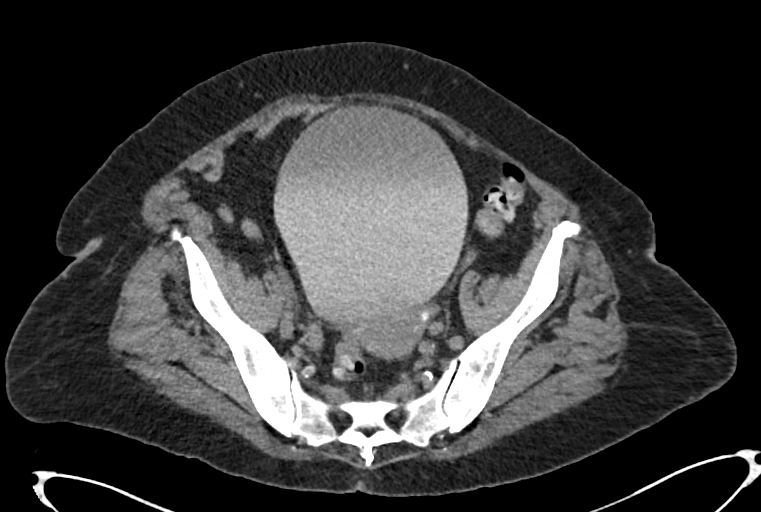
[im 59/151  soft-tissue]
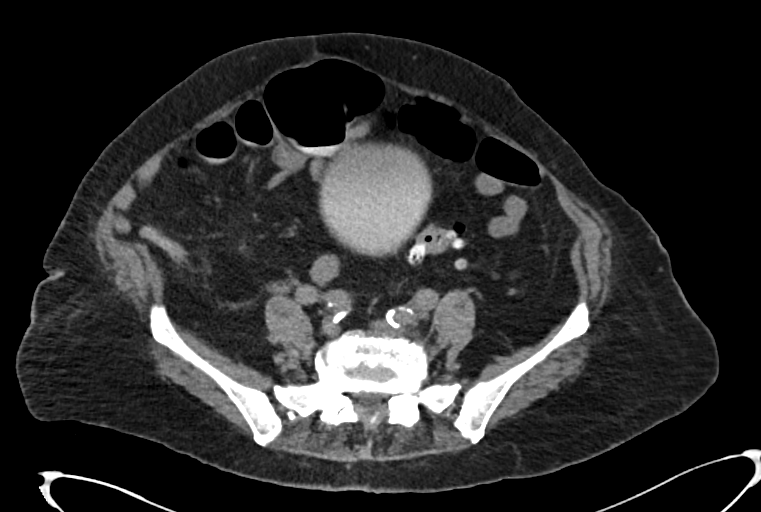
[im 78/151  soft-tissue]
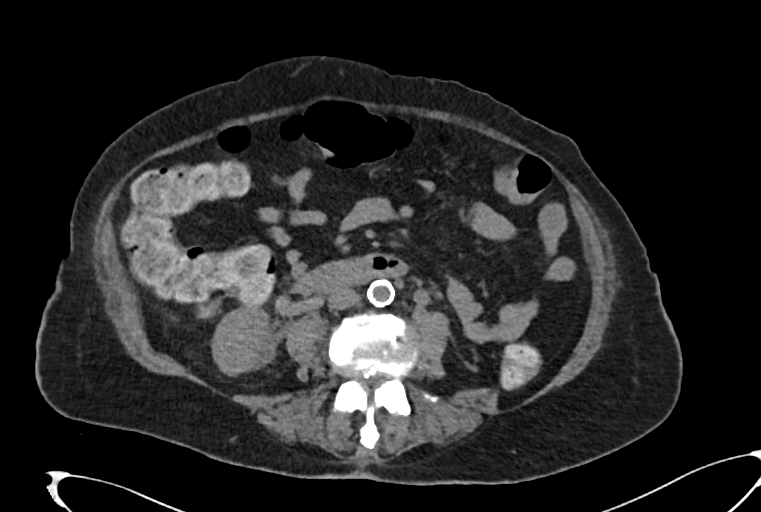
[im 92/151  soft-tissue]
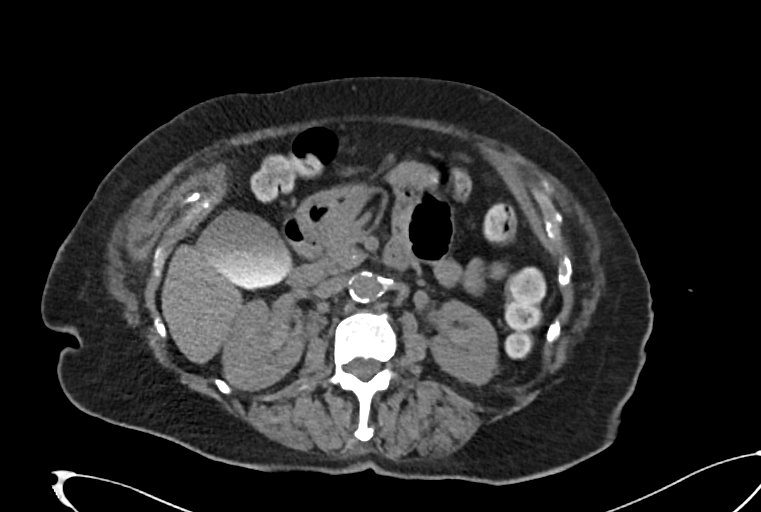
[im 107/151  soft-tissue]
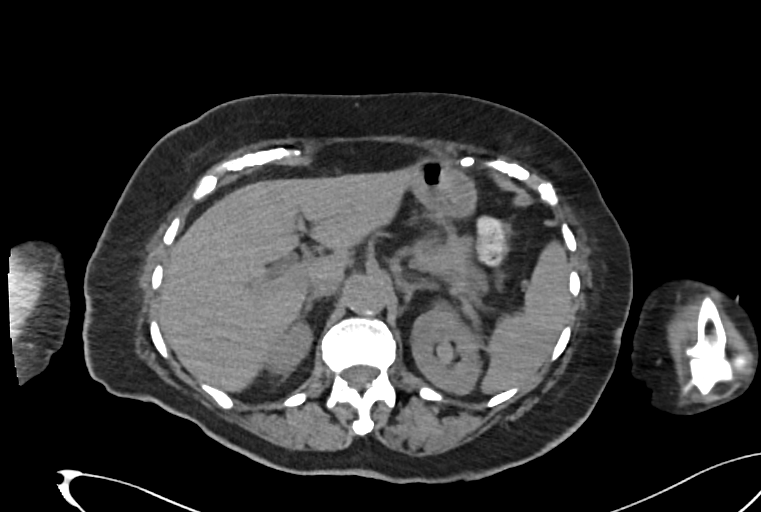
[im 126/151  soft-tissue]
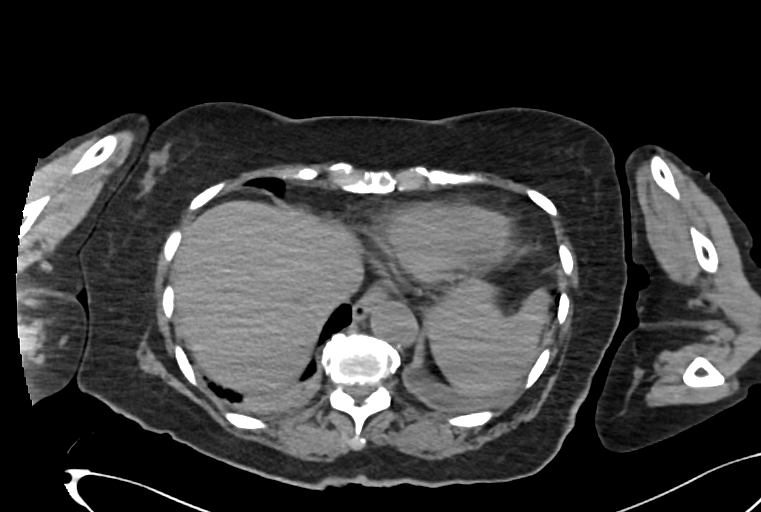
[im 141/151  soft-tissue]
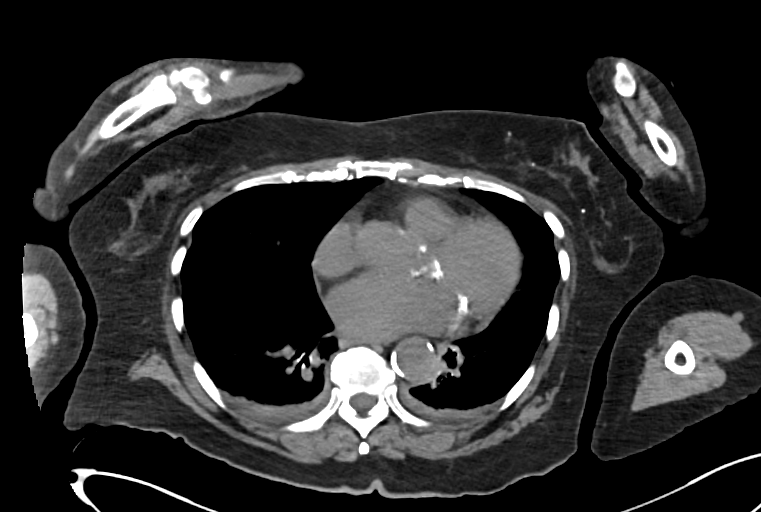
[im 141/151  bone]
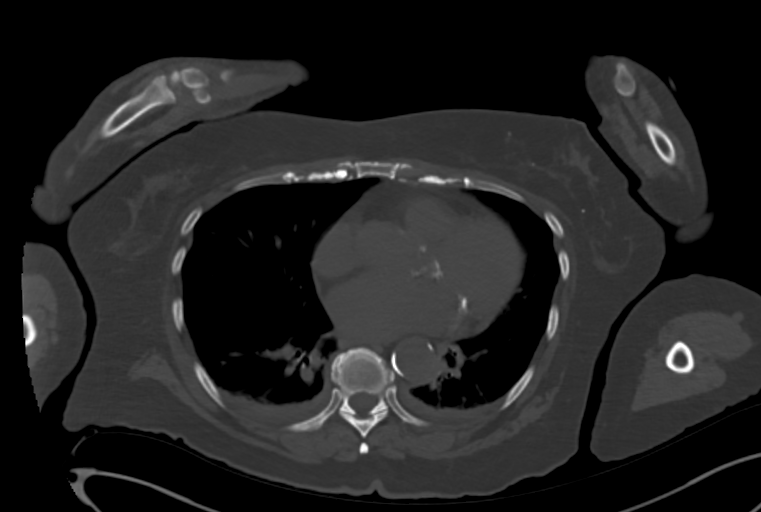

[Series 4: abdomen_pelvis cor 3.00 br40 s3 · coronal · 0.90mm/px · 3 of 92 slices shown]
[im 31/92  soft-tissue]
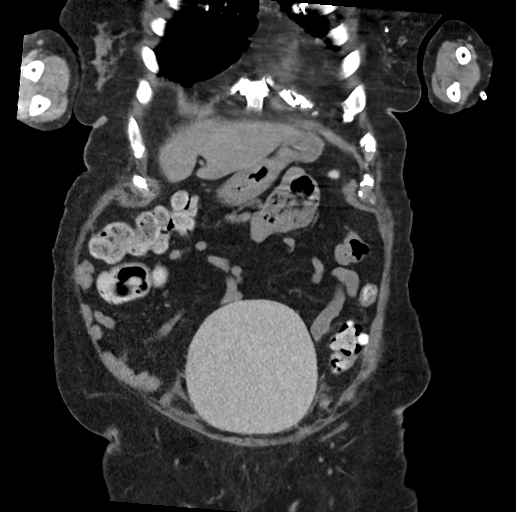
[im 41/92  soft-tissue]
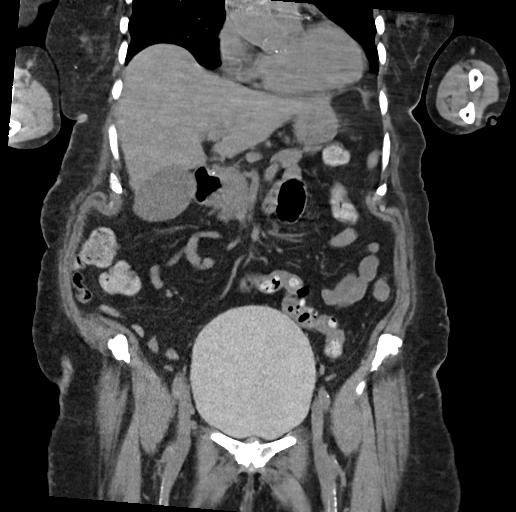
[im 51/92  soft-tissue]
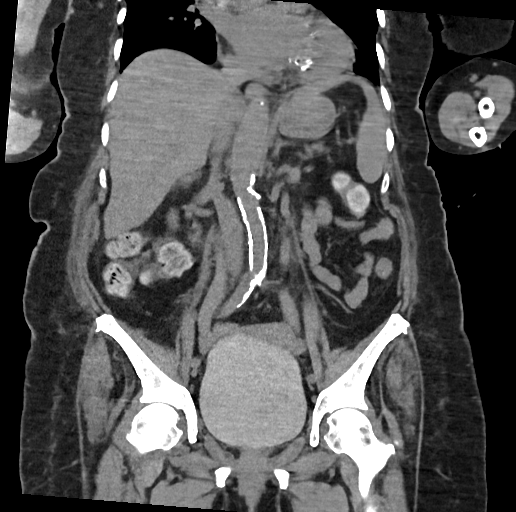

[12 of 46 positions shown; findings below may reference images not displayed]

Primary:     FERRERA, TALIYAH                                                        Date: 12/

DIAGNOSTIC STUDIES

EXAM

COMPUTED TOMOGRAPHY, ABDOMEN AND PELVIS; WITHOUT CONTRAST MATERIAL CPT 25429

INDICATION

abdominal pain
AMS. ABD PAIN, TENDERNESS. CT/NM 1/0. TJ

TECHNIQUE

Contiguous axial tomographic images were obtained from the lung bases to the symphysis pubis
without contrast.

All CT scans at this facility use dose modulation, iterative reconstruction, and/or weight based
dosing when appropriate to reduce radiation dose to as low as reasonably achievable.

COMPARISONS

No priors available for comparison.

FINDINGS

Mild cardiomegaly. No pericardial effusion. Trace bilateral pleural effusions. The liver, spleen,
pancreas and adrenal glands appear grossly unremarkable. Cholelithiasis. Contrast within the
gallbladder. Mild bilateral pelvocaliectasis/hydronephrosis. There is associated hydroureter.
Severely distended urinary bladder. No free air. Moderate sigmoid diverticulosis. There is the
suggestion of a normal appendix. Mild gastric mucosal thickening. No significant small bowel
dilatation peer severe atherosclerotic disease. Subcentimeter retroperitoneal nodes. Mild prominence
of the uterus for the patient's age. The ovaries are partially obscured. Osteopenia. Moderate
degenerative changes within the lumbar spine.

IMPRESSION

1. Mild bilateral hydronephrosis and severely distended urinary bladder. Findings are suspicious
for a bladder outlet obstruction.

Follow-up with a Foley catheter placement.

Tech Notes:

AMS. ABD PAIN, TENDERNESS. CT/NM 1/0. TJ

## 2021-12-01 IMAGING — CR [ID]
1 series · 1 of 1 positions shown · non-contrast
Comparison: none

[x chest ap]
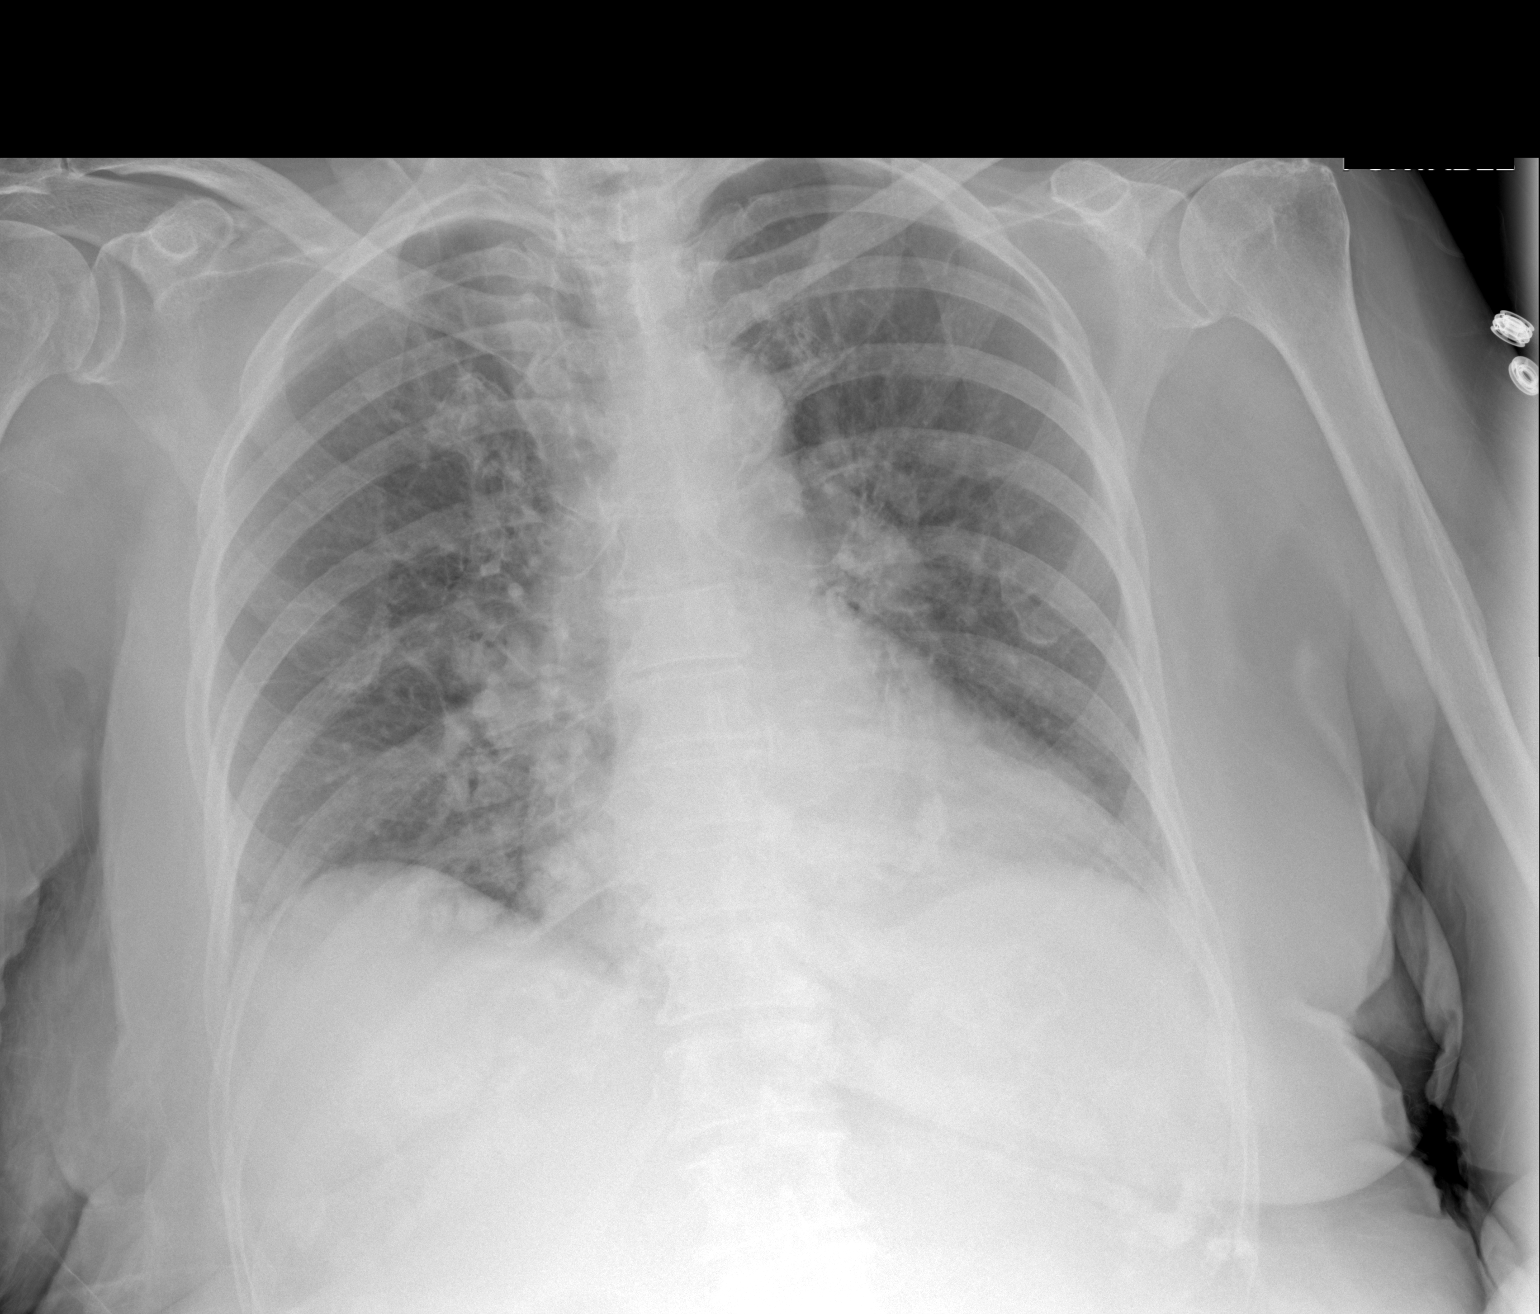

[1 of 1 positions shown; findings below may reference images not displayed]

Primary:     TEICHROEB, ERICK URIEL                                                        Date: 12/

EXAM

CHEST 1 VIEW

INDICATION

leukocytosis
PT HAS AMS. TJ

TECHNIQUE

1 view of the chest was acquired.

COMPARISONS

November 29, 2021

FINDINGS

The heart is enlarged

The lung markings are increased. I see no overt failure or dense consolidation. I see no large
effusions.

IMPRESSION

There is cardiomegaly. The central markings are increased without overt failure. These are similar
to the previous study

Tech Notes:

PT HAS AMS. TJ

## 2021-12-02 IMAGING — CR [ID]
1 series · 1 of 1 positions shown · non-contrast
Comparison: none

[x chest ap]
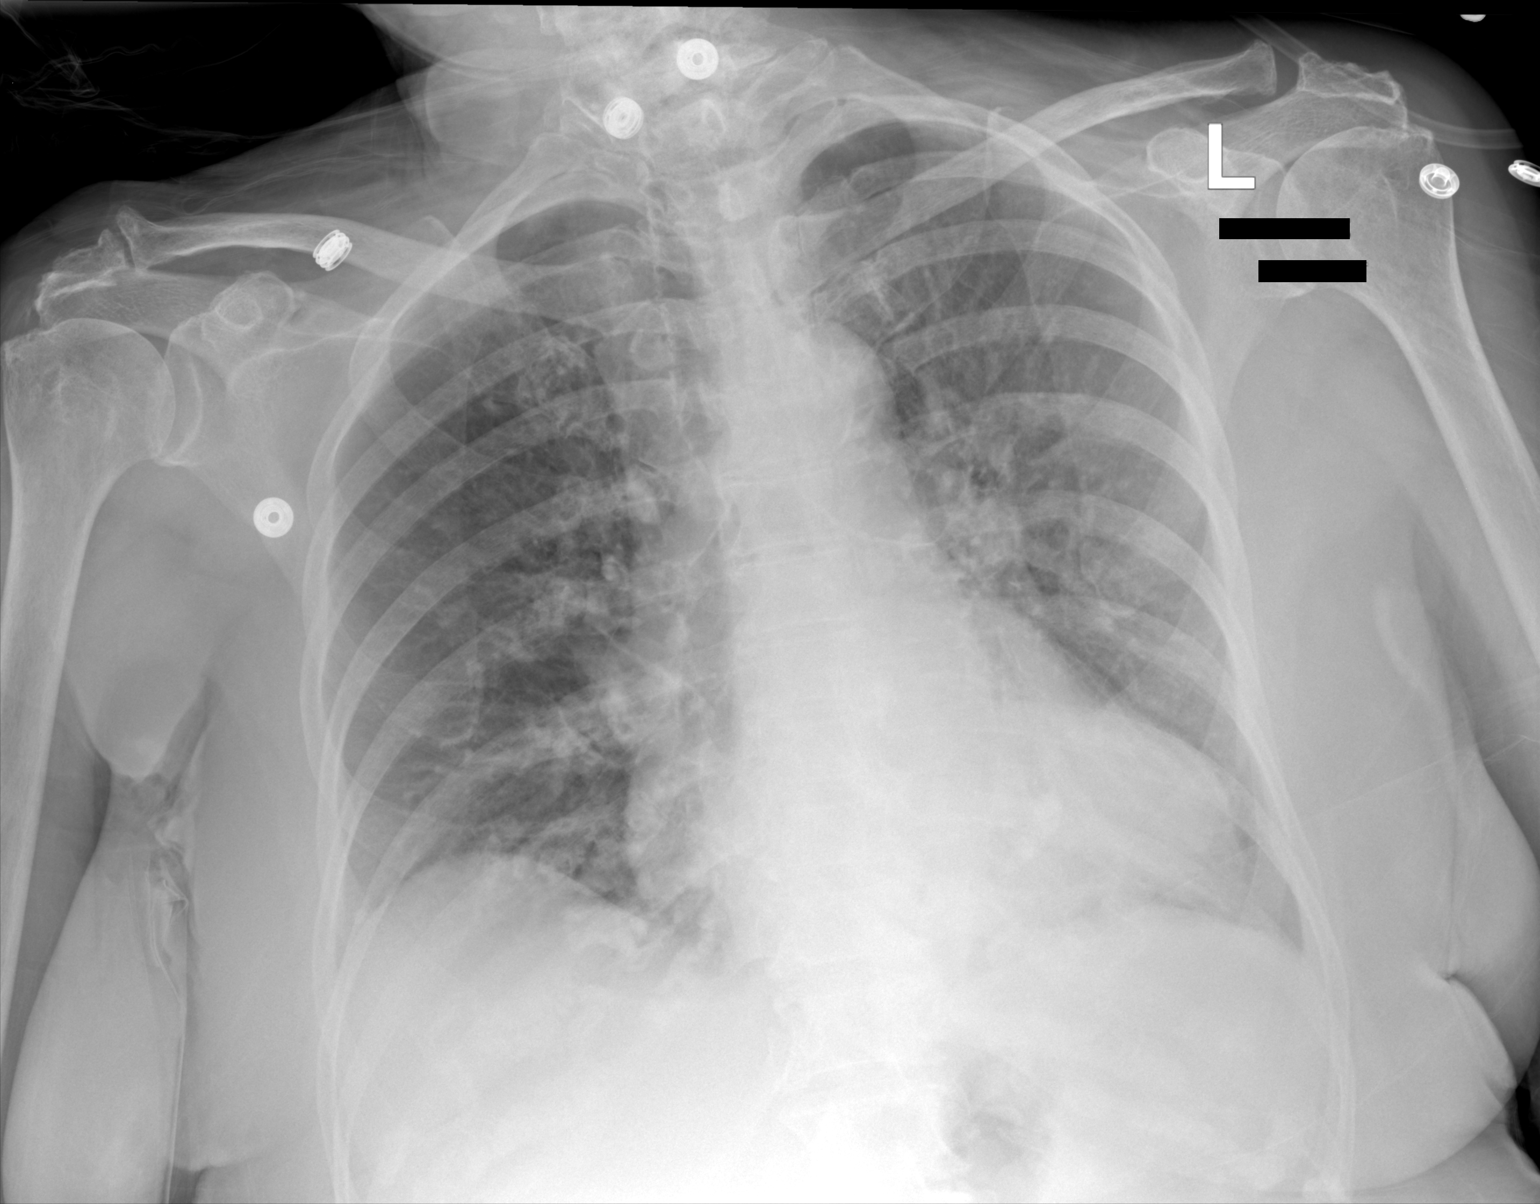

[1 of 1 positions shown; findings below may reference images not displayed]

Primary:     TEEL, JONG MAN                                                        Date: 12/

EXAM

RADIOLOGICAL EXAMINATION, CHEST; SINGLE VIEW, FRONTAL CPT 59494

INDICATION

Dyspnea, Hypoxia
Dyspnea, hypoxia. CF/RG

TECHNIQUE

Single PA portable view of the chest was performed.

COMPARISONS

12/01/2021

FINDINGS

Mild cardiomegaly. Mild cephalization. No gross pneumothorax. Mild peribronchial thickening. No
dense consolidation. No acute fracture.

IMPRESSION

No significant change compared to the prior.

Tech Notes:

Dyspnea, hypoxia. CF/RG

## 2021-12-04 IMAGING — CT BRAIN WO(Adult)
3 of 4 series · 14 of 47 positions shown, 16 images · non-contrast
Comparison: none

[Series 4: brain cor 5.00 hr40 s3 · coronal · 0.32mm/px · 3 of 40 slices shown]
[im 14/40  brain]
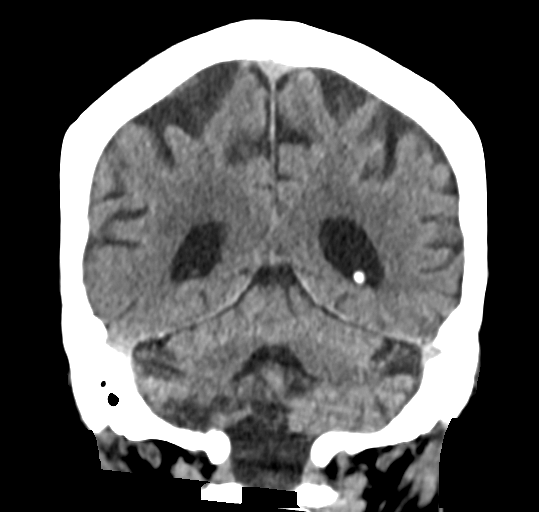
[im 18/40  brain]
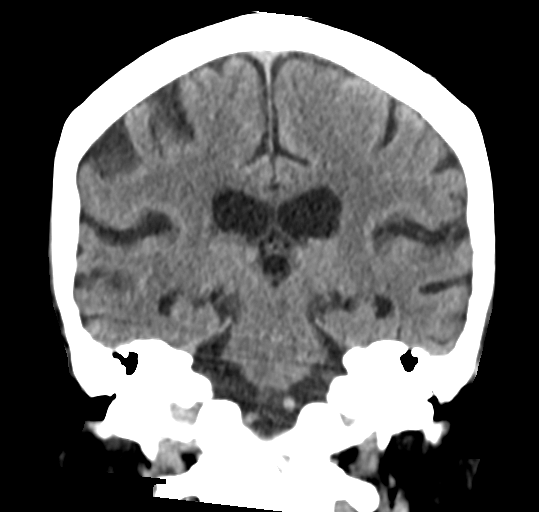
[im 22/40  brain]
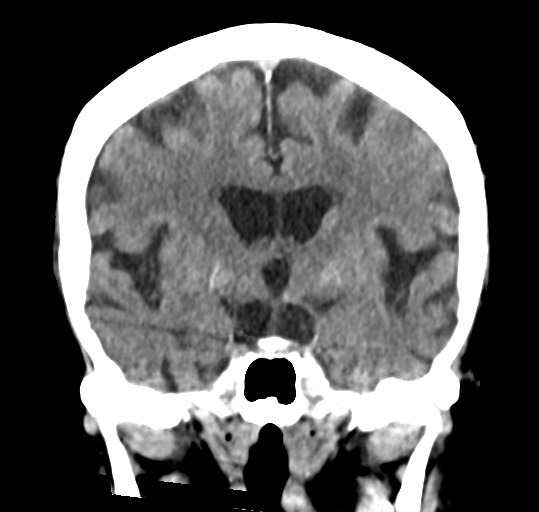

[Series 6: brain sag 5.00 hr40 s3 · sagittal · 0.32mm/px · 3 of 34 slices shown]
[im 12/34  brain]
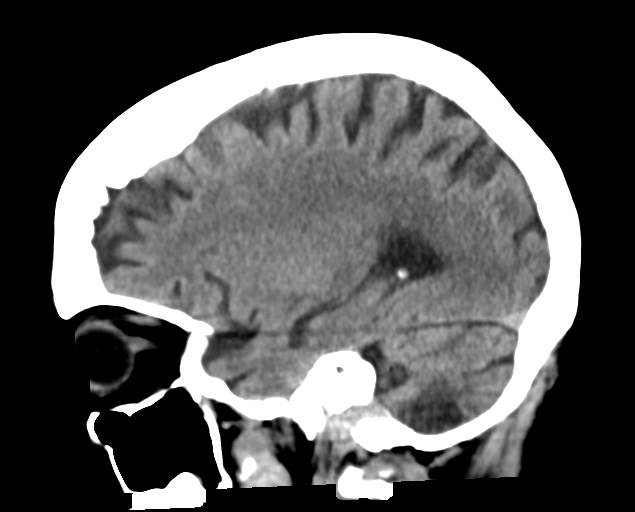
[im 17/34  brain]
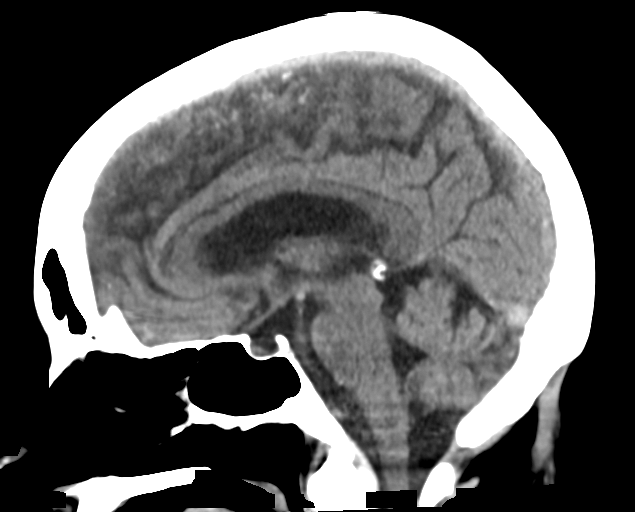
[im 23/34  brain]
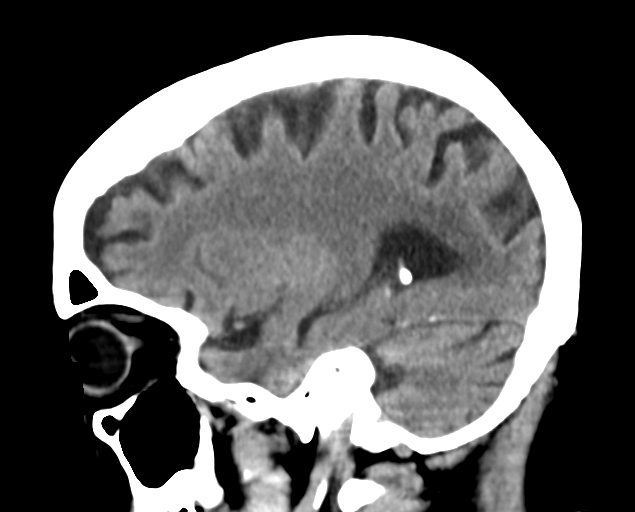

[Series 8: brain ax 2.00 hr60 s3 · axial · 0.34mm/px · z∈[-582,-443]mm · 8 of 79 slices shown, 10 images]
[im 8/79  brain]
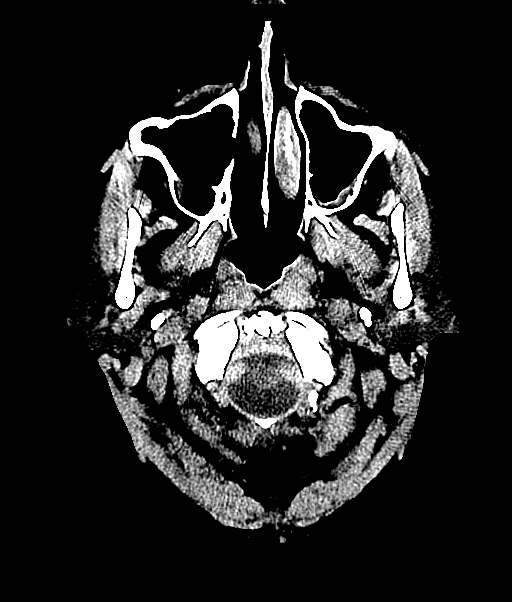
[im 8/79  bone]
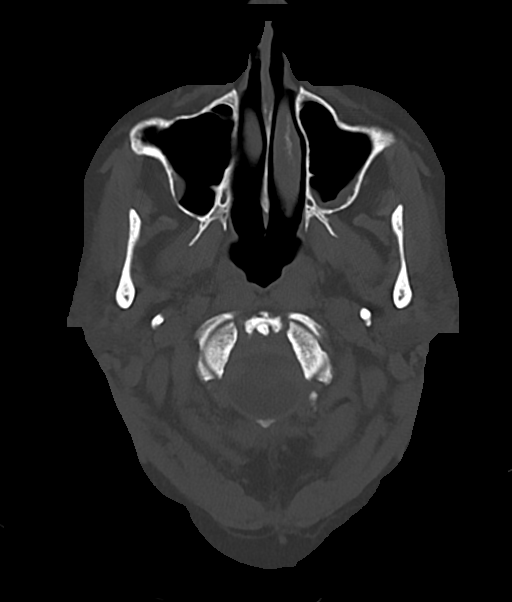
[im 16/79  brain]
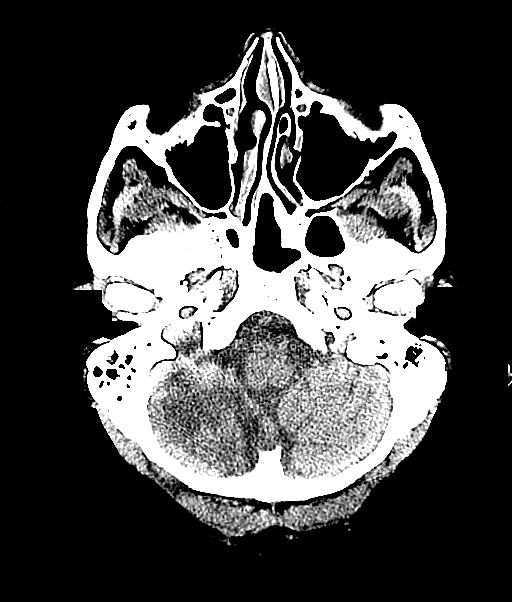
[im 24/79  brain]
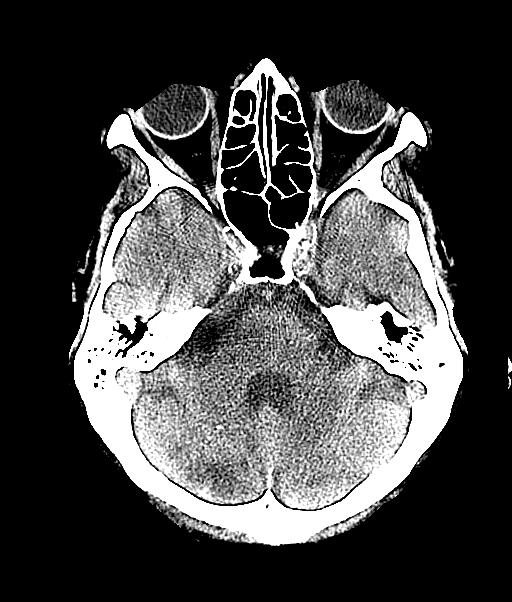
[im 36/79  brain]
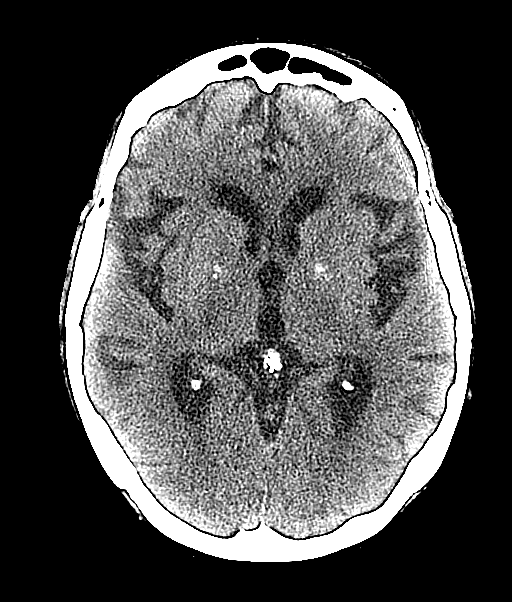
[im 43/79  brain]
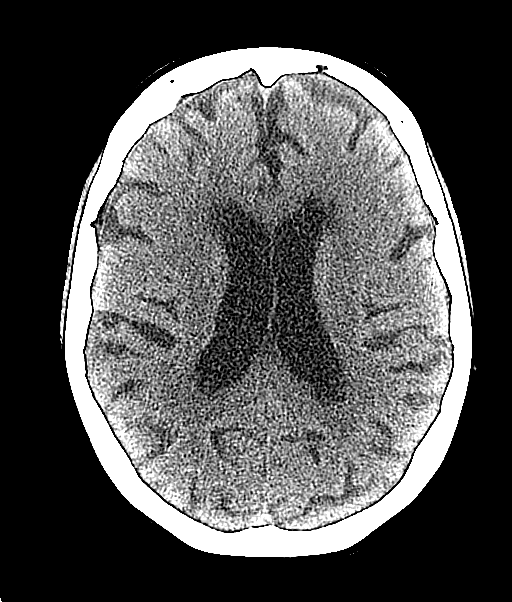
[im 43/79  bone]
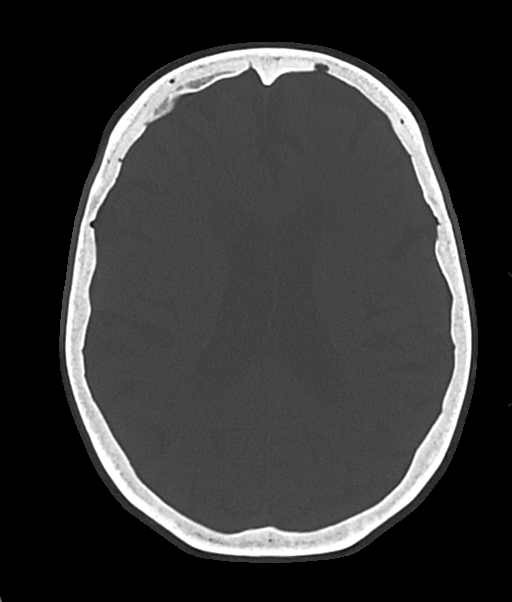
[im 55/79  brain]
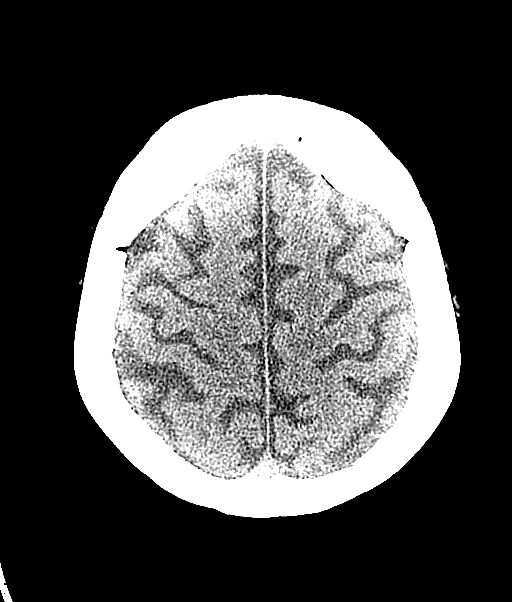
[im 63/79  brain]
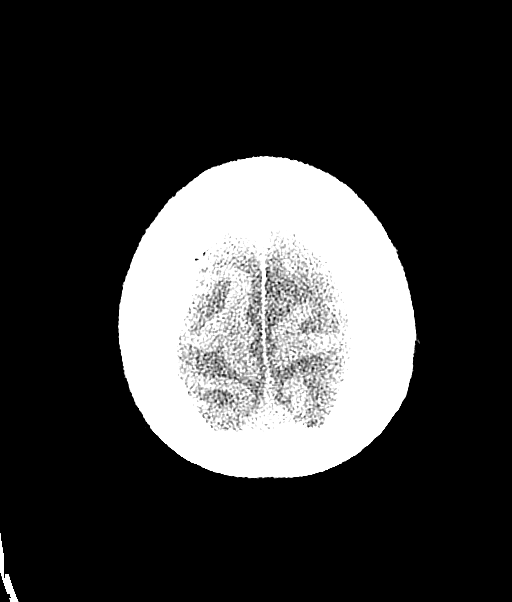
[im 71/79  brain]
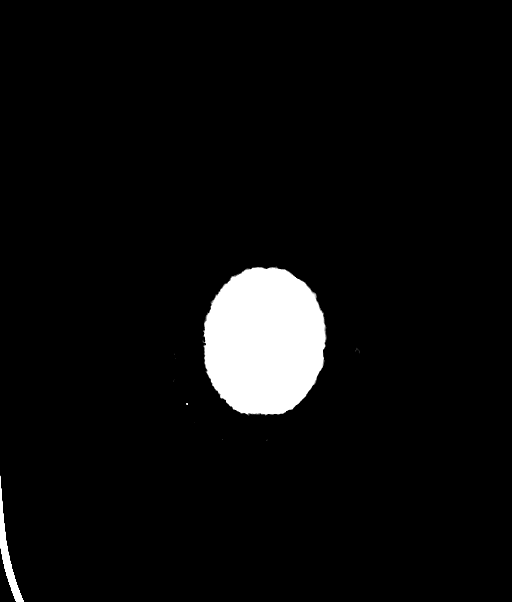

[14 of 47 positions shown; findings below may reference images not displayed]

Primary:     BATISTA CORNIEL, KENIEL ENRIQUE                                                        Date: 12/

DIAGNOSTIC STUDIES

EXAM

COMPUTED TOMOGRAPHY, HEAD OR BRAIN; WITHOUT CONTRAST MATERIAL, CPT 25605

INDICATION

acute confusion - unable to finds words
ACUTE CONFUSION, UNABLE TO FIDND WORDS, HX OF COMPLICATED STROKE, AMS- CT/NM 5/0. TJ/KF

TECHNIQUE

Non-contrast head CT was performed with 5mm axial images.

All CT scans at this facility use dose modulation, iterative reconstruction, and/or weight based
dosing when appropriate to reduce radiation dose to as low as reasonably achievable.

COMPARISONS

11/29/2021

FINDINGS

There is moderate atrophy with associated periventricular white matter changes. There is no
evidence of acute hemorrhage, mass effect of intra/extra-axial fluid collections. Gray/white matter
differentiations is preserved. There are no acute displaced fractures. The visualized paranasal
sinuses are well aerated.

IMPRESSION

1. Moderate atrophy. Chronic focal right cerebellar infarct.

2. No acute hemorrhage, mass effect or intra/extra-axial fluid collections.

Tech Notes:

ACUTE CONFUSION, UNABLE TO FIDND WORDS, HX OF COMPLICATED STROKE, AMS- CT/NM 5/0. TJ/KF

## 2021-12-04 IMAGING — CR [ID]
1 series · 1 of 1 positions shown · non-contrast
Comparison: none

[x chest ap]
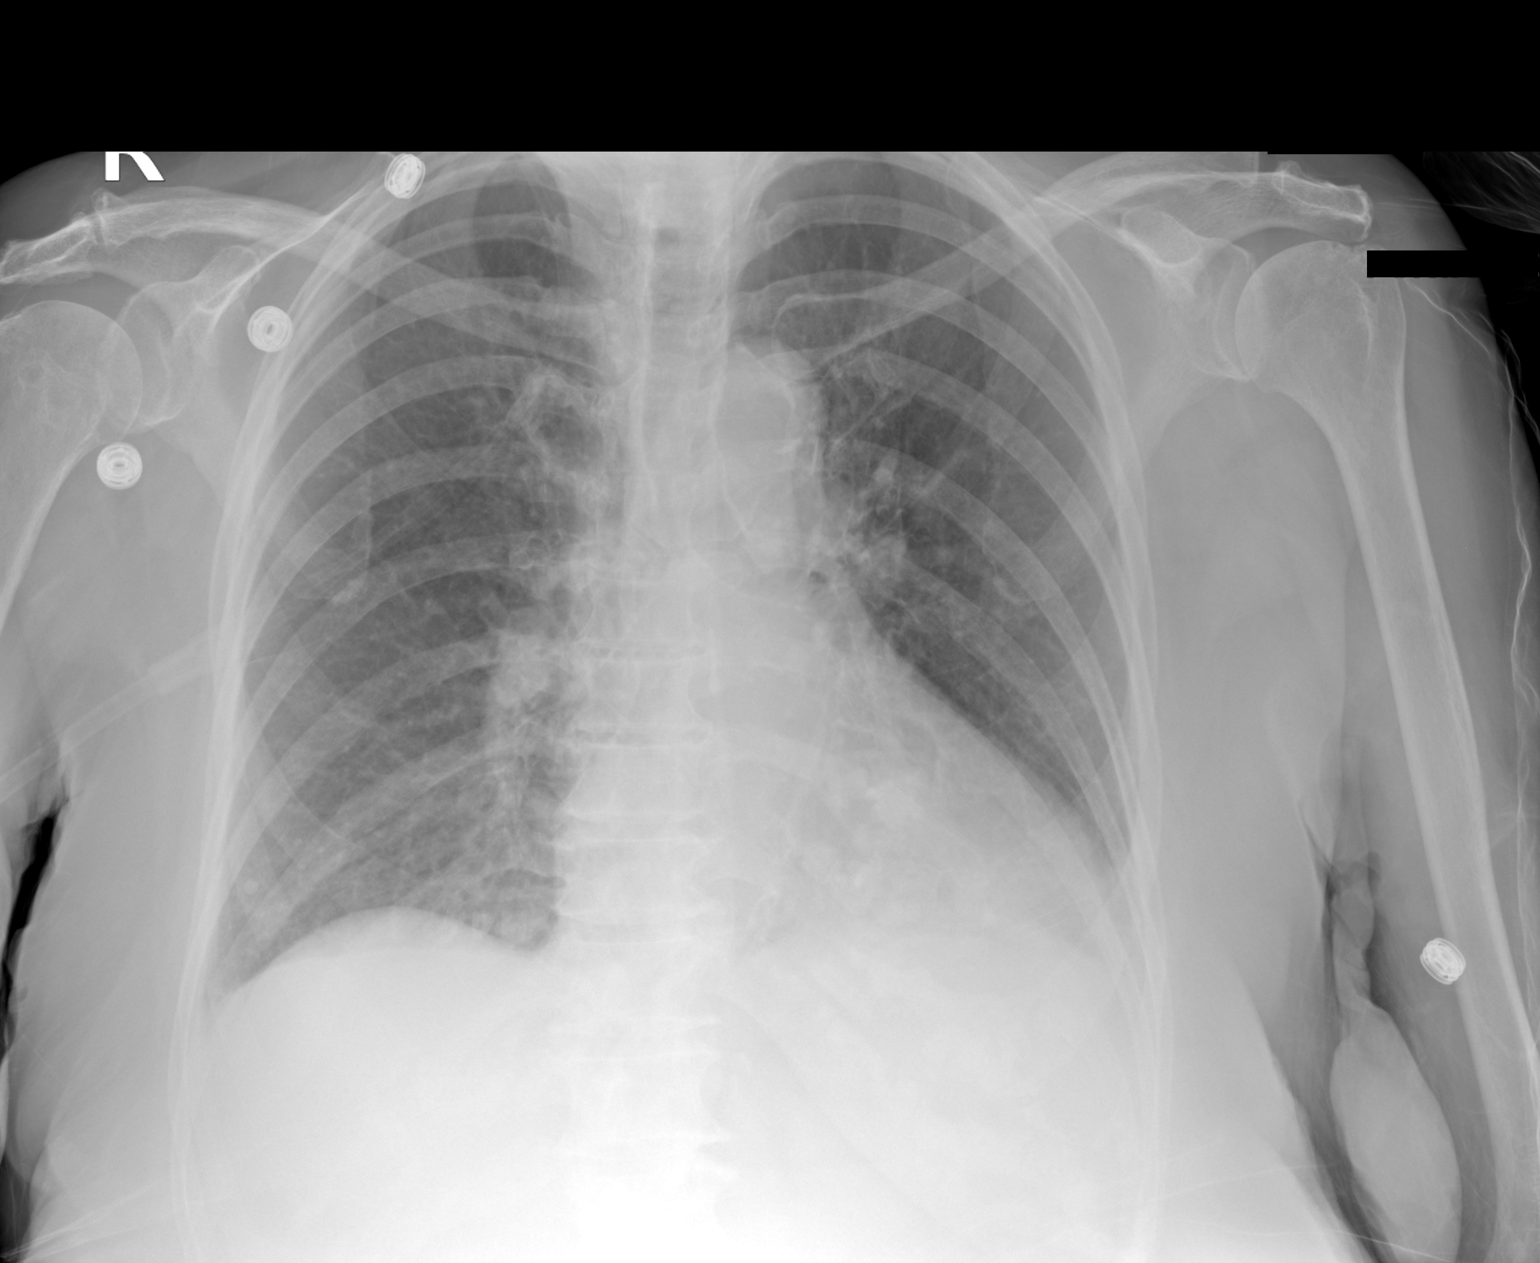

[1 of 1 positions shown; findings below may reference images not displayed]

Primary:     LIEBERMAN, RISHI                                                        Date: 12/

EXAM

RADIOLOGICAL EXAMINATION, CHEST; SINGLE VIEW, FRONTAL CPT 07474

INDICATION

f/u pneumonia/covid
PNEUMONIA , COVID POSITIVE

TECHNIQUE

Single PA portable view of the chest was performed.

COMPARISONS

12/02/2021

FINDINGS

Mild prominence of the cardiac silhouette. Mild bilateral hilar prominence with mild peribronchial
thickening. No gross pneumothorax. Limited evaluation of the retrocardiac area/left lower lobe. No
acute fracture. Limited evaluation of the thoracic spine.

IMPRESSION

1. Mild peribronchial thickening. No dense consolidation.

This is a limited evaluation of the left lower lobe/retrocardiac area. Follow-up with a lateral
image can be obtained.

Tech Notes:

PNEUMONIA , COVID POSITIVE

## 2023-02-07 ENCOUNTER — Encounter: Admit: 2023-02-07 | Discharge: 2023-02-07 | Payer: MEDICARE

## 2023-02-07 DEATH — deceased
